# Patient Record
Sex: Female | Born: 1985 | Race: White | Hispanic: No | Marital: Single | Smoking: Current every day smoker
Health system: Southern US, Community
[De-identification: ages and names within clinical notes are randomized; demographics above are authoritative.]

## PROBLEM LIST (undated history)

## (undated) DIAGNOSIS — G43909 Migraine, unspecified, not intractable, without status migrainosus: Secondary | ICD-10-CM

## (undated) HISTORY — PX: FINGER TENDON REPAIR: SHX1640

## (undated) HISTORY — DX: Migraine, unspecified, not intractable, without status migrainosus: G43.909

## (undated) HISTORY — PX: TONSILLECTOMY: SUR1361

---

## 2007-03-19 ENCOUNTER — Emergency Department: Payer: Self-pay | Admitting: Emergency Medicine

## 2007-03-22 ENCOUNTER — Ambulatory Visit: Payer: Self-pay | Admitting: Orthopaedic Surgery

## 2008-01-23 LAB — HM PAP SMEAR: HM Pap smear: NEGATIVE

## 2009-10-09 ENCOUNTER — Emergency Department: Payer: Self-pay | Admitting: Emergency Medicine

## 2013-01-27 ENCOUNTER — Emergency Department: Payer: Self-pay | Admitting: Emergency Medicine

## 2016-03-01 ENCOUNTER — Other Ambulatory Visit: Payer: Self-pay | Admitting: Obstetrics and Gynecology

## 2016-03-01 DIAGNOSIS — N63 Unspecified lump in unspecified breast: Secondary | ICD-10-CM

## 2016-03-16 ENCOUNTER — Other Ambulatory Visit: Payer: Self-pay

## 2016-03-16 ENCOUNTER — Inpatient Hospital Stay: Admission: RE | Admit: 2016-03-16 | Payer: Self-pay | Source: Ambulatory Visit

## 2016-03-16 ENCOUNTER — Ambulatory Visit: Payer: Self-pay | Attending: Obstetrics and Gynecology

## 2016-08-13 ENCOUNTER — Emergency Department
Admission: EM | Admit: 2016-08-13 | Discharge: 2016-08-13 | Disposition: A | Discharge status: Home / Self Care | Payer: 59 | Attending: Emergency Medicine | Admitting: Emergency Medicine

## 2016-08-13 DIAGNOSIS — F17210 Nicotine dependence, cigarettes, uncomplicated: Secondary | ICD-10-CM | POA: Insufficient documentation

## 2016-08-13 DIAGNOSIS — B9689 Other specified bacterial agents as the cause of diseases classified elsewhere: Secondary | ICD-10-CM

## 2016-08-13 DIAGNOSIS — L089 Local infection of the skin and subcutaneous tissue, unspecified: Secondary | ICD-10-CM | POA: Diagnosis present

## 2016-08-13 DIAGNOSIS — A499 Bacterial infection, unspecified: Secondary | ICD-10-CM | POA: Insufficient documentation

## 2016-08-13 MED ORDER — CLINDAMYCIN HCL 150 MG PO CAPS
300.0000 mg | ORAL_CAPSULE | Freq: Once | ORAL | Status: AC
  Administered 2016-08-13: 300 mg via ORAL
  Filled 2016-08-13: qty 2

## 2016-08-13 MED ORDER — CLINDAMYCIN HCL 300 MG PO CAPS: 300.0000 mg | ORAL_CAPSULE | Freq: Three times a day (TID) | ORAL | 0 refills | Status: AC

## 2016-08-13 MED ORDER — TRAMADOL HCL 50 MG PO TABS
50.0000 mg | ORAL_TABLET | Freq: Once | ORAL | Status: AC
  Administered 2016-08-13: 50 mg via ORAL
  Filled 2016-08-13: qty 1

## 2016-08-13 MED ORDER — TRAMADOL HCL 50 MG PO TABS: 50.0000 mg | ORAL_TABLET | Freq: Four times a day (QID) | ORAL | 0 refills | Status: DC | PRN

## 2016-08-13 NOTE — ED Notes (Signed)
Pt has tattoo on right arm which is a coverup - was putting a&d ointment on it. Has redness, small pustules and pt states her whole arm is sore.

## 2016-08-13 NOTE — ED Triage Notes (Signed)
Patient c/o possible infection to tattoo she received on upper right arm on Monday. Patient has visible swelling/rash to area. Pt reports pain radiates up and down arm, and limited shoulder mobility.

## 2016-08-13 NOTE — Discharge Instructions (Signed)
Please follow up with the primary care provider of your choice for symptoms that are not improving over the next 2 days.  Return to the ER if symptoms change or worsen if you are unable to schedule an appointment.

## 2016-08-13 NOTE — ED Provider Notes (Signed)
[facility] Emergency Department Provider Note  ____________________________________________  Time seen: Approximately 9:11 PM  I have reviewed the triage vital signs and the nursing notes.   HISTORY  Chief Complaint Wound Infection (tattoo)   HPI Michelle Fitzgerald is a 31 y.o. female who presents to the emergency department for evaluation of right arm infection. She states that she got a tattoo on Monday and began having pain and drainage 2 days ago. She denies fever. She has been applying a and D ointment and using peroxide on the are without any relief.  History reviewed. No pertinent past medical history.  There are no active problems to display for this patient.   Past Surgical History:  Procedure Laterality Date  . FINGER TENDON REPAIR    . TONSILLECTOMY      Prior to Admission medications   Medication Sig Start Date End Date Taking? Authorizing Provider  clindamycin (CLEOCIN) 300 MG capsule Take 1 capsule (300 mg total) by mouth 3 (three) times daily. 08/13/16 08/23/16  Chinita Pesterari B Arvo Ealy, FNP  traMADol (ULTRAM) 50 MG tablet Take 1 tablet (50 mg total) by mouth every 6 (six) hours as needed. 08/13/16   Chinita Pesterari B Kelyse Pask, FNP    Allergies Patient has no known allergies.  Family History  Problem Relation Age of Onset  . Diabetes Father   . Diabetes Maternal Grandmother   . Cancer Maternal Grandmother   . Hypertension Maternal Grandmother     Social History Social History  Substance Use Topics  . Smoking status: Current Some Day Smoker    Packs/day: 0.50    Types: Cigarettes  . Smokeless tobacco: Never Used  . Alcohol use No    Review of Systems  Constitutional: Negative for fever/chills Respiratory:  Negative for shortness of breath. Musculoskeletal:  Negative for pain. Skin:  Positive for redness, tenderness, and drainage Neurological: Negative for headaches, focal weakness or  numbness. ____________________________________________   PHYSICAL EXAM:  VITAL SIGNS: ED Triage Vitals  Enc Vitals Group     BP 08/13/16 2031 136/85     Pulse Rate 08/13/16 2031 (!) 124     Resp 08/13/16 2031 20     Temp 08/13/16 2031 98.1 F (36.7 C)     Temp Source 08/13/16 2031 Oral     SpO2 08/13/16 2031 99 %     Weight 08/13/16 2032 245 lb (111.1 kg)     Height 08/13/16 2032 5\' 3"  (1.6 m)     Head Circumference --      Peak Flow --      Pain Score 08/13/16 2032 4     Pain Loc --      Pain Edu? --      Excl. in GC? --      Constitutional: Alert and oriented. Well appearing and in no acute distress. Eyes: Conjunctivae are normal. EOMI. Nose: No congestion/rhinnorhea. Mouth/Throat: Mucous membranes are moist.   Neck: No stridor. Cardiovascular: Good peripheral circulation. Respiratory: Normal respiratory effort.  No retractions. Musculoskeletal: FROM throughout. Neurologic:  Normal speech and language. No gross focal neurologic deficits are appreciated. Skin:  Central area of the freshly placed tattoo on the right deltoid is erythematous with pustules scattered throughout over the red ink colored areas. No lymphangitis is noted.  ____________________________________________   LABS (all labs ordered are listed, but only abnormal results are displayed)  Labs Reviewed - No data to display ____________________________________________  EKG   ____________________________________________  RADIOLOGY  not indicated ____________________________________________   PROCEDURES  Procedure(s) performed:  None. ____________________________________________   INITIAL IMPRESSION / ASSESSMENT AND PLAN / ED COURSE  31 year old female presenting to the emergency department for treatment of skin infection to the right upper arm after having a tattoo placed of days ago. She will be started on clindamycin and tramadol and advised to follow-up with the primary care provider of  her choice in 2 days if not improving. She was instructed to return to the emergency department immediately for symptoms that change or worsen if she is unable schedule an appointment.  Pertinent labs & imaging results that were available during my care of the patient were reviewed by me and considered in my medical decision making (see chart for details).   ____________________________________________   FINAL CLINICAL IMPRESSION(S) / ED DIAGNOSES  Final diagnoses:  Localized bacterial skin infection    Discharge Medication List as of 08/13/2016  9:17 PM    START taking these medications   Details  clindamycin (CLEOCIN) 300 MG capsule Take 1 capsule (300 mg total) by mouth 3 (three) times daily., Starting Sat 08/13/2016, Until Tue 08/23/2016, Print    traMADol (ULTRAM) 50 MG tablet Take 1 tablet (50 mg total) by mouth every 6 (six) hours as needed., Starting Sat 08/13/2016, Print        If controlled substance prescribed during this visit, 12 month history viewed on the [place] prior to issuing an initial prescription for Schedule II or III opiod.   Note:  This document was prepared using Dragon voice recognition software and may include unintentional dictation errors.    Chinita Pester, FNP 08/14/16 0003    Sharman Cheek, MD 08/14/16 [phone]

## 2017-04-07 ENCOUNTER — Emergency Department
Admission: EM | Admit: 2017-04-07 | Discharge: 2017-04-07 | Disposition: A | Discharge status: Home / Self Care | Payer: 59 | Attending: Emergency Medicine | Admitting: Emergency Medicine

## 2017-04-07 ENCOUNTER — Other Ambulatory Visit: Payer: Self-pay

## 2017-04-07 DIAGNOSIS — F17210 Nicotine dependence, cigarettes, uncomplicated: Secondary | ICD-10-CM | POA: Insufficient documentation

## 2017-04-07 DIAGNOSIS — M5431 Sciatica, right side: Secondary | ICD-10-CM

## 2017-04-07 DIAGNOSIS — M5442 Lumbago with sciatica, left side: Secondary | ICD-10-CM | POA: Insufficient documentation

## 2017-04-07 DIAGNOSIS — M545 Low back pain: Secondary | ICD-10-CM | POA: Diagnosis present

## 2017-04-07 MED ORDER — METHYLPREDNISOLONE SODIUM SUCC 125 MG IJ SOLR
125.0000 mg | Freq: Once | INTRAMUSCULAR | Status: AC
Start: 2017-04-07 — End: 2017-04-07
  Administered 2017-04-07: 125 mg via INTRAMUSCULAR
  Filled 2017-04-07: qty 2

## 2017-04-07 MED ORDER — PREDNISONE 50 MG PO TABS: ORAL_TABLET | ORAL | 0 refills | Status: DC

## 2017-04-07 NOTE — ED Provider Notes (Signed)
[facility] Emergency Department Provider Note  ____________________________________________  Time seen: Approximately 9:32 PM  I have reviewed the triage vital signs and the nursing notes.   HISTORY  Chief Complaint Back Pain    HPI Michelle Fitzgerald is a 31 y.o. female presents to the emergency department with 10 out of 10 low back pain with radiculopathy of the right lower extremity.  Patient reports that she has a history of sciatica and her symptoms feel like episodes that she has experienced in the past.  She denies falls or mechanisms of trauma.  Patient originally sought care at urgent care and was referred to the emergency department because radiculopathy did not improve after 20 minutes after 60 mg of Toradol was administered.  Patient denies chest pain, chest tightness, shortness of breath, nausea, vomiting abdominal pain.   History reviewed. No pertinent past medical history.  There are no active problems to display for this patient.   Past Surgical History:  Procedure Laterality Date  . FINGER TENDON REPAIR    . TONSILLECTOMY      Prior to Admission medications   Medication Sig Start Date End Date Taking? Authorizing Provider  predniSONE (DELTASONE) 50 MG tablet Take one 50 mg tablet by mouth for the next five days. 04/07/17   Orvil FeilWoods, Mubashir Mallek M, PA-C  traMADol (ULTRAM) 50 MG tablet Take 1 tablet (50 mg total) by mouth every 6 (six) hours as needed. 08/13/16   Chinita Pesterriplett, Cari B, FNP    Allergies Patient has no known allergies.  Family History  Problem Relation Age of Onset  . Diabetes Father   . Diabetes Maternal Grandmother   . Cancer Maternal Grandmother   . Hypertension Maternal Grandmother     Social History Social History   Tobacco Use  . Smoking status: Current Some Day Smoker    Packs/day: 0.50    Types: Cigarettes  . Smokeless tobacco: Never Used  Substance Use Topics  . Alcohol use: No  . Drug use: No     Review of  Systems  Constitutional: No fever/chills Eyes: No visual changes. No discharge ENT: No upper respiratory complaints. Cardiovascular: no chest pain. Respiratory: no cough. No SOB. Gastrointestinal: No abdominal pain.  No nausea, no vomiting.  No diarrhea.  No constipation. Genitourinary: Negative for dysuria. No hematuria Musculoskeletal: Patient had low back pain with right lower extremity radiculopathy.  Skin: Negative for rash, abrasions, lacerations, ecchymosis. Neurological: Negative for headaches, focal weakness or numbness.   ____________________________________________   PHYSICAL EXAM:  VITAL SIGNS: ED Triage Vitals  Enc Vitals Group     BP 04/07/17 2004 112/73     Pulse Rate 04/07/17 2004 77     Resp 04/07/17 2004 18     Temp 04/07/17 2004 97.8 F (36.6 C)     Temp Source 04/07/17 2004 Oral     SpO2 04/07/17 2004 95 %     Weight 04/07/17 2002 250 lb (113.4 kg)     Height 04/07/17 2002 5\' 3"  (1.6 m)     Head Circumference --      Peak Flow --      Pain Score 04/07/17 2002 7     Pain Loc --      Pain Edu? --      Excl. in GC? --      Constitutional: Alert and oriented. Well appearing and in no acute distress. Eyes: Conjunctivae are normal. PERRL. EOMI. Head: Atraumatic. Cardiovascular: Normal rate, regular rhythm. Normal S1 and S2.  Good peripheral circulation. Respiratory: Normal respiratory effort without tachypnea or retractions. Lungs CTAB. Good air entry to the bases with no decreased or absent breath sounds. Gastrointestinal: Bowel sounds 4 quadrants. Soft and nontender to palpation. No guarding or rigidity. No palpable masses. No distention. No CVA tenderness. Musculoskeletal: Full range of motion to all extremities. No gross deformities appreciated.  Patient has positive straight leg raise test, right. Neurologic:  Normal speech and language. No gross focal neurologic deficits are appreciated.  Skin:  Skin is warm, dry and intact. No rash  noted. Psychiatric: Mood and affect are normal. Speech and behavior are normal. Patient exhibits appropriate insight and judgement.   ____________________________________________   LABS (all labs ordered are listed, but only abnormal results are displayed)  Labs Reviewed - No data to display ____________________________________________  EKG   ____________________________________________  RADIOLOGY   No results found.  ____________________________________________    PROCEDURES  Procedure(s) performed:    Procedures    Medications  methylPREDNISolone sodium succinate (SOLU-MEDROL) 125 mg/2 mL injection 125 mg (not administered)     ____________________________________________   INITIAL IMPRESSION / ASSESSMENT AND PLAN / ED COURSE  Pertinent labs & imaging results that were available during my care of the patient were reviewed by me and considered in my medical decision making (see chart for details).  Review of the [place] CSRS was performed in accordance of the [facility] prior to dispensing any controlled drugs.     Assessment and plan Low back pain with right lower extremity radiculopathy Patient presents to the emergency department with low back pain with right lower extremity radiculopathy.  Patient was previously given Toradol and patient reports that her pain improved after patient came to the emergency department.  Patient was given Solu-Medrol in the emergency department and discharged with prednisone.  She was advised to follow-up with primary care.  Vital signs are reassuring prior to discharge.  All patient questions were answered.     ____________________________________________  FINAL CLINICAL IMPRESSION(S) / ED DIAGNOSES  Final diagnoses:  Sciatica of right side      NEW MEDICATIONS STARTED DURING THIS VISIT:  ED Discharge Orders        Ordered    predniSONE (DELTASONE) 50 MG tablet     04/07/17 2127          This chart was dictated  using voice recognition software/Dragon. Despite best efforts to proofread, errors can occur which can change the meaning. Any change was purely unintentional.    Orvil FeilWoods, Anzleigh Slaven M, PA-C 04/07/17 2138    Arnaldo NatalMalinda, Paul F, MD 04/07/17 [phone]

## 2017-04-07 NOTE — ED Triage Notes (Signed)
[facility] with c/o lower back pain x2 days. Pt reports pain "shoots down my right leg into the right ankle". Pt states she was told she had a "bulging disc that was compressing the sciatic nerve 4 years ago". Pt was given 60mg  Toradol IM at 715 PTA. Pt denies any recent trauma, injury, or falls to account for back pain. Pt is A&O, in NAD; RR even, regular, and unlabored.

## 2017-04-07 NOTE — ED Notes (Signed)
Pt stating she is having a sciatic attack and has not had one in the last 4 years.  She had an XR from a chiropractor that showed she has a buldging disc at L2.  He has been in pain the last 2 days and has been taking Aleve with no relief.  Her pain is in her right lower back/hip going down into her leg.  She states it feels like pins and needles but she does not have and decreased nerve perception in that leg/foot.  "It just hurts" she says.

## 2017-04-12 ENCOUNTER — Other Ambulatory Visit: Payer: Self-pay | Admitting: Student

## 2017-04-12 DIAGNOSIS — M5441 Lumbago with sciatica, right side: Principal | ICD-10-CM

## 2017-04-12 DIAGNOSIS — G8929 Other chronic pain: Secondary | ICD-10-CM

## 2017-04-12 DIAGNOSIS — M5442 Lumbago with sciatica, left side: Principal | ICD-10-CM

## 2017-04-19 ENCOUNTER — Other Ambulatory Visit: Payer: Self-pay | Admitting: Student

## 2017-04-19 ENCOUNTER — Ambulatory Visit
Admission: RE | Admit: 2017-04-19 | Discharge: 2017-04-19 | Disposition: A | Discharge status: Home / Self Care | Payer: 59 | Source: Ambulatory Visit | Attending: Student | Admitting: Student

## 2017-04-19 DIAGNOSIS — M5127 Other intervertebral disc displacement, lumbosacral region: Secondary | ICD-10-CM | POA: Insufficient documentation

## 2017-04-19 DIAGNOSIS — M5442 Lumbago with sciatica, left side: Secondary | ICD-10-CM

## 2017-04-19 DIAGNOSIS — M5441 Lumbago with sciatica, right side: Secondary | ICD-10-CM

## 2017-04-19 DIAGNOSIS — G8929 Other chronic pain: Secondary | ICD-10-CM

## 2017-04-19 DIAGNOSIS — M48061 Spinal stenosis, lumbar region without neurogenic claudication: Secondary | ICD-10-CM | POA: Insufficient documentation

## 2017-04-19 DIAGNOSIS — M5126 Other intervertebral disc displacement, lumbar region: Secondary | ICD-10-CM | POA: Diagnosis not present

## 2017-04-25 ENCOUNTER — Other Ambulatory Visit: Payer: Self-pay | Admitting: Family Medicine

## 2017-04-25 DIAGNOSIS — M5416 Radiculopathy, lumbar region: Secondary | ICD-10-CM

## 2017-05-03 ENCOUNTER — Ambulatory Visit
Admission: RE | Admit: 2017-05-03 | Discharge: 2017-05-03 | Disposition: A | Discharge status: Home / Self Care | Payer: 59 | Source: Ambulatory Visit | Attending: Family Medicine | Admitting: Family Medicine

## 2017-05-03 DIAGNOSIS — M5416 Radiculopathy, lumbar region: Secondary | ICD-10-CM

## 2017-05-03 MED ORDER — IOPAMIDOL (ISOVUE-M 200) INJECTION 41%
1.0000 mL | Freq: Once | INTRAMUSCULAR | Status: AC
  Administered 2017-05-03: 1 mL via EPIDURAL

## 2017-05-03 MED ORDER — METHYLPREDNISOLONE ACETATE 40 MG/ML INJ SUSP (RADIOLOG
120.0000 mg | Freq: Once | INTRAMUSCULAR | Status: AC
  Administered 2017-05-03: 120 mg via EPIDURAL

## 2017-05-03 NOTE — Discharge Instructions (Signed)

## 2017-05-09 ENCOUNTER — Other Ambulatory Visit: Payer: Self-pay | Admitting: Student

## 2017-05-09 DIAGNOSIS — M5416 Radiculopathy, lumbar region: Secondary | ICD-10-CM

## 2017-05-24 ENCOUNTER — Ambulatory Visit
Admission: RE | Admit: 2017-05-24 | Discharge: 2017-05-24 | Disposition: A | Discharge status: Home / Self Care | Payer: 59 | Source: Ambulatory Visit | Attending: Student | Admitting: Student

## 2017-05-24 DIAGNOSIS — M5416 Radiculopathy, lumbar region: Secondary | ICD-10-CM

## 2017-05-24 MED ORDER — METHYLPREDNISOLONE ACETATE 40 MG/ML INJ SUSP (RADIOLOG
120.0000 mg | Freq: Once | INTRAMUSCULAR | Status: AC
  Administered 2017-05-24: 120 mg via EPIDURAL

## 2017-05-24 MED ORDER — IOPAMIDOL (ISOVUE-M 200) INJECTION 41%
1.0000 mL | Freq: Once | INTRAMUSCULAR | Status: AC
  Administered 2017-05-24: 1 mL via EPIDURAL

## 2017-06-07 ENCOUNTER — Other Ambulatory Visit: Payer: 59

## 2017-06-07 ENCOUNTER — Other Ambulatory Visit: Payer: Self-pay

## 2017-06-07 ENCOUNTER — Encounter
Admission: RE | Admit: 2017-06-07 | Discharge: 2017-06-07 | Disposition: A | Discharge status: Home / Self Care | Payer: BLUE CROSS/BLUE SHIELD | Source: Ambulatory Visit | Attending: Neurosurgery | Admitting: Neurosurgery

## 2017-06-07 DIAGNOSIS — Z01812 Encounter for preprocedural laboratory examination: Secondary | ICD-10-CM | POA: Insufficient documentation

## 2017-06-07 DIAGNOSIS — M5416 Radiculopathy, lumbar region: Secondary | ICD-10-CM | POA: Diagnosis not present

## 2017-06-07 DIAGNOSIS — Z01810 Encounter for preprocedural cardiovascular examination: Secondary | ICD-10-CM | POA: Insufficient documentation

## 2017-06-07 LAB — CBC
HEMATOCRIT: 44.1 % (ref 35.0–47.0)
Hemoglobin: 15 g/dL (ref 12.0–16.0)
MCH: 29.5 pg (ref 26.0–34.0)
MCHC: 33.9 g/dL (ref 32.0–36.0)
MCV: 87.1 fL (ref 80.0–100.0)
PLATELETS: 317 10*3/uL (ref 150–440)
RBC: 5.06 MIL/uL (ref 3.80–5.20)
RDW: 13.1 % (ref 11.5–14.5)
WBC: 10 10*3/uL (ref 3.6–11.0)

## 2017-06-07 LAB — URINALYSIS, COMPLETE (UACMP) WITH MICROSCOPIC
BILIRUBIN URINE: NEGATIVE
GLUCOSE, UA: NEGATIVE mg/dL
Ketones, ur: NEGATIVE mg/dL
LEUKOCYTES UA: NEGATIVE
NITRITE: NEGATIVE
PH: 5 (ref 5.0–8.0)
Protein, ur: 30 mg/dL — AB
SPECIFIC GRAVITY, URINE: 1.02 (ref 1.005–1.030)

## 2017-06-07 LAB — BASIC METABOLIC PANEL
Anion gap: 8 (ref 5–15)
BUN: 23 mg/dL — AB (ref 6–20)
CALCIUM: 9.2 mg/dL (ref 8.9–10.3)
CO2: 21 mmol/L — AB (ref 22–32)
Chloride: 109 mmol/L (ref 101–111)
Creatinine, Ser: 0.72 mg/dL (ref 0.44–1.00)
GFR calc Af Amer: >60 mL/min (ref >60)
GLUCOSE: 97 mg/dL (ref 65–99)
Potassium: 3.9 mmol/L (ref 3.5–5.1)
Sodium: 138 mmol/L (ref 135–145)

## 2017-06-07 LAB — TYPE AND SCREEN
ABO/RH(D): A POS
Antibody Screen: NEGATIVE

## 2017-06-07 LAB — SURGICAL PCR SCREEN
MRSA, PCR: NEGATIVE
Staphylococcus aureus: NEGATIVE

## 2017-06-07 LAB — PROTIME-INR
INR: 0.87
Prothrombin Time: 11.8 seconds (ref 11.4–15.2)

## 2017-06-07 LAB — APTT: APTT: 26 s (ref 24–36)

## 2017-06-07 NOTE — Pre-Procedure Instructions (Signed)
Abnormal urinalysis result to Dr Vernia BuffYarbrough's offfice

## 2017-06-07 NOTE — Patient Instructions (Signed)
Your [facility] is [facility] on: [facility] on the 2nd floor in the [facility]. To find out your arrival time, please call [phone] between 1PM - 3PM on: Tuesday June 13, 2017  REMEMBER: Instructions that are not followed completely may result in serious medical risk, up to and including death; or upon the discretion of your surgeon and anesthesiologist your surgery may need to be rescheduled.  Do not eat food after midnight the night before your procedure.  No gum chewing or hard candies.  You may however, drink CLEAR liquids up to 2 hours before you are scheduled to arrive at the hospital for your procedure.  Do not drink clear liquids within 2 hours of the start of your surgery.  Clear liquids include: - water  - apple juice without pulp - clear gatorade - black coffee or tea (Do NOT add anything to the coffee or tea) Do NOT drink anything that is not on this list.  No Alcohol for 24 hours before or after surgery.  No Smoking including e-cigarettes for 24 hours prior to surgery. No chewable tobacco products for at least 6 hours prior to surgery. No nicotine patches on the day of surgery.  Notify your doctor if there is any change in your medical condition (cold, fever, infection).  Do not wear jewelry, make-up, hairpins, clips or nail polish.  Do not wear lotions, powders, or perfumes. You may NOT wear deodorant.  Do not shave 48 hours prior to surgery. Men may shave face and neck.  Contacts and dentures may not be worn into surgery.  Do not bring valuables to the hospital. [facility] is not responsible for any belongings or valuables.   TAKE THESE MEDICATIONS THE MORNING OF SURGERY WITH A SIP OF WATER: NO MED NEEDED    Use CHG Soap or wipes as directed on instruction sheet.  Follow recommendations from Cardiologist, Pulmonologist or PCP regarding stopping Aspirin, Coumadin, Plavix, Eliquis, Pradaxa, or  Pletal.  Stop Anti-inflammatories such as Advil, Aleve, Ibuprofen, Motrin, Naproxen, Naprosyn, Goodie powder, or aspirin products,  CELEBREX. (May take Tylenol or Acetaminophen if needed.)  Stop ANY OVER THE COUNTER supplements until after surgery. (May continue Vitamin D, Vitamin B, and multivitamin.)  If you are being admitted to the hospital overnight, leave your suitcase in the car. After surgery it may be brought to your room.  If you are being discharged the day of surgery, you will not be allowed to drive home. You will need someone to drive you home and stay with you that night.   If you are taking public transportation, you will need to have a responsible adult to with you.  Please call the number above if you have any questions about these instructions.

## 2017-06-13 MED ORDER — CEFAZOLIN SODIUM-DEXTROSE 2-4 GM/100ML-% IV SOLN
2.0000 g | INTRAVENOUS | Status: AC
  Administered 2017-06-14: 2 g via INTRAVENOUS

## 2017-06-13 NOTE — Progress Notes (Signed)
ANTIBIOTIC CONSULT NOTE - INITIAL  Pharmacy Consult for Ancef Indication: surgical prophylaxis  No Known Allergies  Patient Measurements:   Adjusted Body Weight:   Vital Signs:   Intake/Output from previous day: No intake/output data recorded. Intake/Output from this shift: No intake/output data recorded.  Labs: No results for input(s): WBC, HGB, PLT, LABCREA, CREATININE in the last 72 hours. Estimated Creatinine Clearance: 119.5 mL/min (by C-G formula based on SCr of 0.72 mg/dL). No results for input(s): VANCOTROUGH, VANCOPEAK, VANCORANDOM, GENTTROUGH, GENTPEAK, GENTRANDOM, TOBRATROUGH, TOBRAPEAK, TOBRARND, AMIKACINPEAK, AMIKACINTROU, AMIKACIN in the last 72 hours.   Microbiology: Recent Results (from the past 720 hour(s))  Surgical pcr screen     Status: None   Collection Time: 06/07/17 11:33 AM  Result Value Ref Range Status   MRSA, PCR NEGATIVE NEGATIVE Final   Staphylococcus aureus NEGATIVE NEGATIVE Final    Comment: (NOTE) The Xpert SA Assay (FDA approved for NASAL specimens in patients 33 years of age and older), is one component of a comprehensive surveillance program. It is not intended to diagnose infection nor to guide or monitor treatment. Performed at [facility], [address] [place], [place] [address]     Medical History: No past medical history on file.  Medications:  Infusions:  Assessment: 32 yof scheduled for lumbar laminectomy/decompression microdiscectomy 2 levels L4-5, L5-S1. Pharmacy consulted to dose Ancef for surgical prophylaxis. No drug allergies noted.   Goal of Therapy:    Plan:  Cefazolin 2 gm IV x 1. Redosing may be necessary in procedures lasting longer than 3 hours. Post op doses may be required in certain procedures/patients. Please re-consult pharmacy if more than a single pre-op dose is required.  Carola FrostNathan A Tiny Chaudhary, Pharm.D., BCPS Clinical Pharmacist 06/13/2017,2:43 PM

## 2017-06-14 ENCOUNTER — Encounter
Admission: RE | Disposition: A | Discharge status: Home / Self Care | Payer: Self-pay | Source: Ambulatory Visit | Attending: Neurosurgery

## 2017-06-14 ENCOUNTER — Ambulatory Visit: Payer: BLUE CROSS/BLUE SHIELD | Admitting: Certified Registered Nurse Anesthetist

## 2017-06-14 ENCOUNTER — Encounter: Payer: Self-pay | Admitting: *Deleted

## 2017-06-14 ENCOUNTER — Ambulatory Visit
Admission: RE | Admit: 2017-06-14 | Discharge: 2017-06-14 | Disposition: A | Discharge status: Home / Self Care | Payer: BLUE CROSS/BLUE SHIELD | Source: Ambulatory Visit | Attending: Neurosurgery | Admitting: Neurosurgery

## 2017-06-14 ENCOUNTER — Other Ambulatory Visit: Payer: Self-pay

## 2017-06-14 ENCOUNTER — Ambulatory Visit: Payer: BLUE CROSS/BLUE SHIELD

## 2017-06-14 DIAGNOSIS — M4696 Unspecified inflammatory spondylopathy, lumbar region: Secondary | ICD-10-CM | POA: Diagnosis not present

## 2017-06-14 DIAGNOSIS — M48061 Spinal stenosis, lumbar region without neurogenic claudication: Secondary | ICD-10-CM | POA: Diagnosis not present

## 2017-06-14 DIAGNOSIS — M5117 Intervertebral disc disorders with radiculopathy, lumbosacral region: Secondary | ICD-10-CM | POA: Insufficient documentation

## 2017-06-14 DIAGNOSIS — Z791 Long term (current) use of non-steroidal anti-inflammatories (NSAID): Secondary | ICD-10-CM | POA: Diagnosis not present

## 2017-06-14 DIAGNOSIS — F17200 Nicotine dependence, unspecified, uncomplicated: Secondary | ICD-10-CM | POA: Diagnosis not present

## 2017-06-14 DIAGNOSIS — Z6841 Body Mass Index (BMI) 40.0 and over, adult: Secondary | ICD-10-CM | POA: Diagnosis not present

## 2017-06-14 DIAGNOSIS — Z419 Encounter for procedure for purposes other than remedying health state, unspecified: Secondary | ICD-10-CM

## 2017-06-14 HISTORY — PX: LUMBAR LAMINECTOMY/DECOMPRESSION MICRODISCECTOMY: SHX5026

## 2017-06-14 LAB — POCT PREGNANCY, URINE: PREG TEST UR: NEGATIVE

## 2017-06-14 SURGERY — LUMBAR LAMINECTOMY/DECOMPRESSION MICRODISCECTOMY 2 LEVELS
Anesthesia: General | Site: Spine Lumbar | Wound class: Clean

## 2017-06-14 MED ORDER — GELATIN ABSORBABLE 12-7 MM EX MISC
CUTANEOUS | Status: AC
  Filled 2017-06-14: qty 1

## 2017-06-14 MED ORDER — DEXAMETHASONE SODIUM PHOSPHATE 10 MG/ML IJ SOLN
INTRAMUSCULAR | Status: DC | PRN
  Administered 2017-06-14: 10 mg via INTRAVENOUS

## 2017-06-14 MED ORDER — ACETAMINOPHEN 10 MG/ML IV SOLN
INTRAVENOUS | Status: AC
  Filled 2017-06-14: qty 100

## 2017-06-14 MED ORDER — BUPIVACAINE-EPINEPHRINE (PF) 0.5% -1:200000 IJ SOLN
INTRAMUSCULAR | Status: AC
  Filled 2017-06-14: qty 30

## 2017-06-14 MED ORDER — SUCCINYLCHOLINE CHLORIDE 20 MG/ML IJ SOLN
INTRAMUSCULAR | Status: DC | PRN
  Administered 2017-06-14: 100 mg via INTRAVENOUS

## 2017-06-14 MED ORDER — ROCURONIUM BROMIDE 50 MG/5ML IV SOLN
INTRAVENOUS | Status: AC
  Filled 2017-06-14: qty 1

## 2017-06-14 MED ORDER — BACITRACIN 50000 UNITS IM SOLR
INTRAMUSCULAR | Status: AC
  Filled 2017-06-14: qty 1

## 2017-06-14 MED ORDER — ONDANSETRON HCL 4 MG/2ML IJ SOLN
4.0000 mg | Freq: Once | INTRAMUSCULAR | Status: AC | PRN
  Administered 2017-06-14: 4 mg via INTRAVENOUS

## 2017-06-14 MED ORDER — THROMBIN (RECOMBINANT) 5000 UNITS EX SOLR
CUTANEOUS | Status: DC | PRN
  Administered 2017-06-14: 5000 [IU] via TOPICAL

## 2017-06-14 MED ORDER — FAMOTIDINE 20 MG PO TABS
ORAL_TABLET | ORAL | Status: AC
  Filled 2017-06-14: qty 1

## 2017-06-14 MED ORDER — SUCCINYLCHOLINE CHLORIDE 20 MG/ML IJ SOLN
INTRAMUSCULAR | Status: AC
  Filled 2017-06-14: qty 1

## 2017-06-14 MED ORDER — BUPIVACAINE-EPINEPHRINE (PF) 0.5% -1:200000 IJ SOLN
INTRAMUSCULAR | Status: DC | PRN
  Administered 2017-06-14: 10 mL

## 2017-06-14 MED ORDER — ACETAMINOPHEN 10 MG/ML IV SOLN
INTRAVENOUS | Status: DC | PRN
  Administered 2017-06-14: 1000 mg via INTRAVENOUS

## 2017-06-14 MED ORDER — MIDAZOLAM HCL 2 MG/2ML IJ SOLN
INTRAMUSCULAR | Status: AC
  Filled 2017-06-14: qty 2

## 2017-06-14 MED ORDER — OXYCODONE HCL 5 MG PO TABS
ORAL_TABLET | ORAL | Status: AC
  Filled 2017-06-14: qty 1

## 2017-06-14 MED ORDER — CEFAZOLIN SODIUM-DEXTROSE 2-4 GM/100ML-% IV SOLN
INTRAVENOUS | Status: AC
  Filled 2017-06-14: qty 100

## 2017-06-14 MED ORDER — FAMOTIDINE 20 MG PO TABS
20.0000 mg | ORAL_TABLET | Freq: Once | ORAL | Status: AC
  Administered 2017-06-14: 20 mg via ORAL

## 2017-06-14 MED ORDER — MIDAZOLAM HCL 2 MG/2ML IJ SOLN
INTRAMUSCULAR | Status: DC | PRN
  Administered 2017-06-14: 2 mg via INTRAVENOUS

## 2017-06-14 MED ORDER — ONDANSETRON HCL 4 MG/2ML IJ SOLN
INTRAMUSCULAR | Status: AC
  Filled 2017-06-14: qty 2

## 2017-06-14 MED ORDER — SODIUM CHLORIDE 0.9 % IV SOLN
INTRAVENOUS | Status: DC | PRN
  Administered 2017-06-14: 40 mL

## 2017-06-14 MED ORDER — OXYCODONE HCL 5 MG PO TABS: 5.0000 mg | ORAL_TABLET | ORAL | 0 refills | Status: DC | PRN

## 2017-06-14 MED ORDER — PROPOFOL 10 MG/ML IV BOLUS
INTRAVENOUS | Status: DC | PRN
  Administered 2017-06-14: 160 mg via INTRAVENOUS

## 2017-06-14 MED ORDER — THROMBIN (RECOMBINANT) 5000 UNITS EX SOLR
CUTANEOUS | Status: AC
  Filled 2017-06-14: qty 5000

## 2017-06-14 MED ORDER — LIDOCAINE HCL (PF) 2 % IJ SOLN
INTRAMUSCULAR | Status: AC
  Filled 2017-06-14: qty 10

## 2017-06-14 MED ORDER — ROCURONIUM BROMIDE 100 MG/10ML IV SOLN
INTRAVENOUS | Status: DC | PRN
  Administered 2017-06-14: 10 mg via INTRAVENOUS
  Administered 2017-06-14: 40 mg via INTRAVENOUS

## 2017-06-14 MED ORDER — DEXAMETHASONE SODIUM PHOSPHATE 10 MG/ML IJ SOLN
INTRAMUSCULAR | Status: AC
  Filled 2017-06-14: qty 1

## 2017-06-14 MED ORDER — METHYLPREDNISOLONE ACETATE 40 MG/ML IJ SUSP
INTRAMUSCULAR | Status: DC | PRN
  Administered 2017-06-14: 40 mg

## 2017-06-14 MED ORDER — BUPIVACAINE LIPOSOME 1.3 % IJ SUSP
INTRAMUSCULAR | Status: AC
Start: 2017-06-14 — End: ?
  Filled 2017-06-14: qty 20

## 2017-06-14 MED ORDER — OXYCODONE HCL 5 MG PO TABS
5.0000 mg | ORAL_TABLET | ORAL | Status: DC | PRN
  Administered 2017-06-14: 5 mg via ORAL
  Filled 2017-06-14 (×2): qty 1

## 2017-06-14 MED ORDER — ONDANSETRON HCL 4 MG/2ML IJ SOLN
INTRAMUSCULAR | Status: AC
  Administered 2017-06-14: 4 mg via INTRAVENOUS
  Filled 2017-06-14: qty 2

## 2017-06-14 MED ORDER — METHYLPREDNISOLONE ACETATE 40 MG/ML IJ SUSP
INTRAMUSCULAR | Status: AC
  Filled 2017-06-14: qty 1

## 2017-06-14 MED ORDER — LIDOCAINE HCL (CARDIAC) 20 MG/ML IV SOLN
INTRAVENOUS | Status: DC | PRN
  Administered 2017-06-14: 100 mg via INTRAVENOUS

## 2017-06-14 MED ORDER — METHOCARBAMOL 500 MG PO TABS: 500.0000 mg | ORAL_TABLET | Freq: Three times a day (TID) | ORAL | 0 refills | Status: DC

## 2017-06-14 MED ORDER — FENTANYL CITRATE (PF) 250 MCG/5ML IJ SOLN
INTRAMUSCULAR | Status: AC
  Filled 2017-06-14: qty 5

## 2017-06-14 MED ORDER — BACITRACIN 50000 UNITS IM SOLR
INTRAMUSCULAR | Status: DC | PRN
  Administered 2017-06-14: 50000 [IU]

## 2017-06-14 MED ORDER — SODIUM CHLORIDE FLUSH 0.9 % IV SOLN
INTRAVENOUS | Status: AC
  Filled 2017-06-14: qty 10

## 2017-06-14 MED ORDER — LACTATED RINGERS IV SOLN
INTRAVENOUS | Status: DC
  Administered 2017-06-14: 07:00:00 via INTRAVENOUS

## 2017-06-14 MED ORDER — ONDANSETRON HCL 4 MG/2ML IJ SOLN
INTRAMUSCULAR | Status: DC | PRN
  Administered 2017-06-14: 4 mg via INTRAVENOUS

## 2017-06-14 MED ORDER — FENTANYL CITRATE (PF) 100 MCG/2ML IJ SOLN
25.0000 ug | INTRAMUSCULAR | Status: DC | PRN
  Administered 2017-06-14 (×4): 25 ug via INTRAVENOUS

## 2017-06-14 MED ORDER — SODIUM CHLORIDE 0.9 % IJ SOLN
INTRAMUSCULAR | Status: AC
  Filled 2017-06-14: qty 50

## 2017-06-14 MED ORDER — BUPIVACAINE HCL (PF) 0.5 % IJ SOLN
INTRAMUSCULAR | Status: AC
Start: 2017-06-14 — End: ?
  Filled 2017-06-14: qty 30

## 2017-06-14 MED ORDER — FENTANYL CITRATE (PF) 100 MCG/2ML IJ SOLN
INTRAMUSCULAR | Status: DC | PRN
  Administered 2017-06-14: 50 ug via INTRAVENOUS
  Administered 2017-06-14 (×2): 100 ug via INTRAVENOUS

## 2017-06-14 MED ORDER — SUGAMMADEX SODIUM 200 MG/2ML IV SOLN
INTRAVENOUS | Status: DC | PRN
  Administered 2017-06-14: 200 mg via INTRAVENOUS

## 2017-06-14 MED ORDER — BUPIVACAINE HCL (PF) 0.5 % IJ SOLN
INTRAMUSCULAR | Status: DC | PRN
  Administered 2017-06-14: 20 mL

## 2017-06-14 MED ORDER — PROPOFOL 10 MG/ML IV BOLUS
INTRAVENOUS | Status: AC
  Filled 2017-06-14: qty 20

## 2017-06-14 MED ORDER — FENTANYL CITRATE (PF) 100 MCG/2ML IJ SOLN
INTRAMUSCULAR | Status: AC
  Administered 2017-06-14: 25 ug via INTRAVENOUS
  Filled 2017-06-14: qty 2

## 2017-06-14 MED ORDER — SUGAMMADEX SODIUM 200 MG/2ML IV SOLN
INTRAVENOUS | Status: AC
  Filled 2017-06-14: qty 2

## 2017-06-14 SURGICAL SUPPLY — 66 items
BLADE BOVIE TIP EXT 4 (BLADE) ×3 IMPLANT
BUR NEURO DRILL SOFT 3.0X3.8M (BURR) ×3 IMPLANT
CANISTER SUCT 1200ML W/VALVE (MISCELLANEOUS) ×6 IMPLANT
CHLORAPREP W/TINT 26ML (MISCELLANEOUS) ×6 IMPLANT
CNTNR SPEC 2.5X3XGRAD LEK (MISCELLANEOUS) ×1
CONT SPEC 4OZ STER OR WHT (MISCELLANEOUS) ×2
CONTAINER SPEC 2.5X3XGRAD LEK (MISCELLANEOUS) ×1 IMPLANT
COUNTER NEEDLE 20/40 LG (NEEDLE) ×3 IMPLANT
COVER LIGHT HANDLE STERIS (MISCELLANEOUS) ×6 IMPLANT
CUP MEDICINE 2OZ PLAST GRAD ST (MISCELLANEOUS) ×6 IMPLANT
DERMABOND ADVANCED (GAUZE/BANDAGES/DRESSINGS) ×2
DERMABOND ADVANCED .7 DNX12 (GAUZE/BANDAGES/DRESSINGS) ×1 IMPLANT
DRAPE C-ARM 42X72 X-RAY (DRAPES) ×6 IMPLANT
DRAPE LAPAROTOMY 100X77 ABD (DRAPES) ×3 IMPLANT
DRAPE MICROSCOPE SPINE 48X150 (DRAPES) ×3 IMPLANT
DRAPE POUCH INSTRU U-SHP 10X18 (DRAPES) ×3 IMPLANT
DRAPE SURG 17X11 SM STRL (DRAPES) ×12 IMPLANT
DRSG TEGADERM 4X4.75 (GAUZE/BANDAGES/DRESSINGS) ×3 IMPLANT
DRSG TELFA 4X3 1S NADH ST (GAUZE/BANDAGES/DRESSINGS) IMPLANT
ELECT CAUTERY BLADE TIP 2.5 (TIP) ×3
ELECT EZSTD 165MM 6.5IN (MISCELLANEOUS) ×3
ELECTRODE CAUTERY BLDE TIP 2.5 (TIP) ×1 IMPLANT
ELECTRODE EZSTD 165MM 6.5IN (MISCELLANEOUS) ×1 IMPLANT
EVICEL AIRLESS SPRAY ACCES (MISCELLANEOUS) ×3 IMPLANT
FRAME EYE SHIELD (PROTECTIVE WEAR) ×3 IMPLANT
GLOVE BIO SURGEON STRL SZ 6.5 (GLOVE) ×4 IMPLANT
GLOVE BIO SURGEONS STRL SZ 6.5 (GLOVE) ×2
GLOVE BIOGEL PI IND STRL 7.0 (GLOVE) ×2 IMPLANT
GLOVE BIOGEL PI INDICATOR 7.0 (GLOVE) ×4
GLOVE SURG SYN 8.5  E (GLOVE) ×6
GLOVE SURG SYN 8.5 E (GLOVE) ×3 IMPLANT
GOWN SRG XL LVL 3 NONREINFORCE (GOWNS) ×1 IMPLANT
GOWN STRL NON-REIN TWL XL LVL3 (GOWNS) ×2
GOWN STRL REUS W/ TWL LRG LVL3 (GOWN DISPOSABLE) ×1 IMPLANT
GOWN STRL REUS W/TWL LRG LVL3 (GOWN DISPOSABLE) ×2
GRADUATE 1200CC STRL 31836 (MISCELLANEOUS) ×3 IMPLANT
IV CATH ANGIO 12GX3 LT BLUE (NEEDLE) ×3 IMPLANT
KIT SPINAL PRONEVIEW (KITS) ×3 IMPLANT
KNIFE BAYONET SHORT DISCETOMY (MISCELLANEOUS) ×3 IMPLANT
MARKER SKIN DUAL TIP RULER LAB (MISCELLANEOUS) ×6 IMPLANT
NDL SAFETY ECLIPSE 18X1.5 (NEEDLE) ×1 IMPLANT
NEEDLE HYPO 18GX1.5 SHARP (NEEDLE) ×2
NEEDLE HYPO 22GX1.5 SAFETY (NEEDLE) ×3 IMPLANT
NS IRRIG 1000ML POUR BTL (IV SOLUTION) ×3 IMPLANT
PACK LAMINECTOMY NEURO (CUSTOM PROCEDURE TRAY) ×3 IMPLANT
PAD ARMBOARD 7.5X6 YLW CONV (MISCELLANEOUS) ×3 IMPLANT
PATTIES SURGICAL .5X1.5 (GAUZE/BANDAGES/DRESSINGS) IMPLANT
SPOGE SURGIFLO 8M (HEMOSTASIS) ×4
SPONGE SURGIFLO 8M (HEMOSTASIS) ×2 IMPLANT
STAPLER SKIN PROX 35W (STAPLE) IMPLANT
SUT DVC VLOC 3-0 CL 6 P-12 (SUTURE) ×3 IMPLANT
SUT NURALON 4 0 TR CR/8 (SUTURE) IMPLANT
SUT VIC AB 0 CT1 27 (SUTURE) ×2
SUT VIC AB 0 CT1 27XCR 8 STRN (SUTURE) ×1 IMPLANT
SUT VIC AB 2-0 CT1 18 (SUTURE) ×3 IMPLANT
SUT VICRYL 0 AB UR-6 (SUTURE) ×3 IMPLANT
SUT VICRYL 0 UR6 27IN ABS (SUTURE) IMPLANT
SYR 10ML LL (SYRINGE) ×3 IMPLANT
SYR 20CC LL (SYRINGE) ×3 IMPLANT
SYR 30ML LL (SYRINGE) ×6 IMPLANT
SYR 3ML LL SCALE MARK (SYRINGE) ×3 IMPLANT
TOWEL OR 17X26 4PK STRL BLUE (TOWEL DISPOSABLE) ×12 IMPLANT
TUBE MATRX SPINL 18MM 9CM DISP (INSTRUMENTS) ×2
TUBE METRX SPINAL 18X9 DISP (INSTRUMENTS) ×1 IMPLANT
TUBING CONNECTING 10 (TUBING) ×2 IMPLANT
TUBING CONNECTING 10' (TUBING) ×1

## 2017-06-14 NOTE — H&P (Signed)
I have reviewed and confirmed my history and physical from 06/01/2017 with no additions or changes. Plan for R L4-5 and L5-S1 microdiscectomy.  Risks and benefits reviewed.  Heart sounds normal no MRG. Chest Clear to Auscultation Bilaterally.

## 2017-06-14 NOTE — Anesthesia Procedure Notes (Signed)
Procedure Name: Intubation Date/Time: 06/14/2017 7:33 AM Performed by: Frazier, Susan, CRNA Pre-anesthesia Checklist: Patient identified, Emergency Drugs available, Suction available, Patient being monitored and Timeout performed Patient Re-evaluated:Patient Re-evaluated prior to induction Oxygen Delivery Method: Circle system utilized Preoxygenation: Pre-oxygenation with 100% oxygen Induction Type: IV induction Ventilation: Mask ventilation without difficulty Laryngoscope Size: Miller and 2 Grade View: Grade I Tube type: Oral Tube size: 7.5 mm Number of attempts: 1 Airway Equipment and Method: Stylet and LTA kit utilized Placement Confirmation: ETT inserted through vocal cords under direct vision,  positive ETCO2 and breath sounds checked- equal and bilateral Secured at: 22 cm Tube secured with: Tape Dental Injury: Teeth and Oropharynx as per pre-operative assessment        

## 2017-06-14 NOTE — Anesthesia Preprocedure Evaluation (Signed)
Anesthesia Evaluation  Patient identified by MRN, date of birth, ID band Patient awake    Reviewed: Allergy & Precautions, NPO status , Patient's Chart, lab work & pertinent test results  Airway Mallampati: II  TM Distance: <3 FB     Dental  (+) Teeth Intact   Pulmonary Current Smoker,    Pulmonary exam normal        Cardiovascular negative cardio ROS Normal cardiovascular exam     Neuro/Psych negative neurological ROS  negative psych ROS   GI/Hepatic negative GI ROS, Neg liver ROS,   Endo/Other  Morbid obesity  Renal/GU negative Renal ROS  negative genitourinary   Musculoskeletal  (+) Arthritis , Osteoarthritis,    Abdominal Normal abdominal exam  (+)   Peds negative pediatric ROS (+)  Hematology negative hematology ROS (+)   Anesthesia Other Findings   Reproductive/Obstetrics                             Anesthesia Physical Anesthesia Plan  ASA: II  Anesthesia Plan: General   Post-op Pain Management:    Induction: Intravenous  PONV Risk Score and Plan:   Airway Management Planned: Oral ETT  Additional Equipment:   Intra-op Plan:   Post-operative Plan:   Informed Consent: I have reviewed the patients History and Physical, chart, labs and discussed the procedure including the risks, benefits and alternatives for the proposed anesthesia with the patient or authorized representative who has indicated his/her understanding and acceptance.   Dental advisory given  Plan Discussed with: CRNA and Surgeon  Anesthesia Plan Comments:         Anesthesia Quick Evaluation

## 2017-06-14 NOTE — Progress Notes (Signed)
Pain 4/10 , able to ambulate to restroom with assist , voided 200 ml , tolerating po fluids .

## 2017-06-14 NOTE — Discharge Instructions (Addendum)
AMBULATORY SURGERY  °DISCHARGE INSTRUCTIONS ° ° °1) The drugs that you were given will stay in your system until tomorrow so for the next 24 hours you should not: ° °A) Drive an automobile °B) Make any legal decisions °C) Drink any alcoholic beverage ° ° °2) You may resume regular meals tomorrow.  Today it is better to start with liquids and gradually work up to solid foods. ° °You may eat anything you prefer, but it is better to start with liquids, then soup and crackers, and gradually work up to solid foods. ° ° °3) Please notify your doctor immediately if you have any unusual bleeding, trouble breathing, redness and pain at the surgery site, drainage, fever, or pain not relieved by medication. °4)  ° °5) Your post-operative visit with Dr.                     °           °     is: Date:                        Time:   ° °Please call to schedule your post-operative visit. ° °6) Additional Instructions: ° ° ° ° ° ° °Your surgeon has performed an operation on your lumbar spine (low back) to relieve pressure on one or more nerves. Many times, patients feel better immediately after surgery and can “overdo it.” Even if you feel well, it is important that you follow these activity guidelines. If you do not let your back heal properly from the surgery, you can increase the chance of a disc herniation and/or return of your symptoms. The following are instructions to help in your recovery once you have been discharged from the hospital. ° °* Do not take anti-inflammatory medications for 3 days after surgery (naproxen [Aleve], ibuprofen [Advil, Motrin], celecoxib [Celebrex], etc.) ° °Activity  °  °No bending, lifting, or twisting (“BLT”). Avoid lifting objects heavier than 10 pounds (gallon milk jug).  Where possible, avoid household activities that involve lifting, bending, pushing, or pulling such as laundry, vacuuming, grocery shopping, and childcare. Try to arrange for help from friends and family for these activities  while your back heals. ° °Increase physical activity slowly as tolerated.  Taking short walks is encouraged, but avoid strenuous exercise. Do not jog, run, bicycle, lift weights, or participate in any other exercises unless specifically allowed by your doctor. Avoid prolonged sitting, including car rides. ° °Talk to your doctor before resuming sexual activity. ° °You should not drive until cleared by your doctor. ° °Until released by your doctor, you should not return to work or school.  You should rest at home and let your body heal.  ° °You may shower two days after your surgery.  After showering, lightly dab your incision dry. Do not take a tub bath or go swimming for 3 weeks, or until approved by your doctor at your follow-up appointment. ° °If you smoke, we strongly recommend that you quit.  Smoking has been proven to interfere with normal healing in your back and will dramatically reduce the success rate of your surgery. Please contact [facility] (800-QUIT-NOW) and use the resources at www.[facility].com for assistance in stopping smoking. ° °Surgical Incision °  °If you have a dressing on your incision, you may remove it three days after your surgery. Keep your incision area clean and dry. ° °If you have staples or stitches on your incision, you   should have a follow up scheduled for removal. If you do not have staples or stitches, you will have steri-strips (small pieces of surgical tape) or Dermabond glue. The steri-strips/glue should begin to peel away within about a week (it is fine if the steri-strips fall off before then). If the strips are still in place one week after your surgery, you may gently remove them. ° °Diet          ° ° You may return to your usual diet. Be sure to stay hydrated. ° °When to Contact Us ° °Although your surgery and recovery will likely be uneventful, you may have some residual numbness, aches, and pains in your back and/or legs. This is normal and should improve in the next few  weeks. ° °However, should you experience any of the following, contact us immediately: °• New numbness or weakness °• Pain that is progressively getting worse, and is not relieved by your pain medications or rest °• Bleeding, redness, swelling, pain, or drainage from surgical incision °• Chills or flu-like symptoms °• Fever greater than 101.0 F (38.3 C) °• Problems with bowel or bladder functions °• Difficulty breathing or shortness of breath °• Warmth, tenderness, or swelling in your calf ° °Contact Information °• During office hours (Monday-Friday 9 am to 5 pm), please call your physician at [phone] °• After hours and weekends, please call the [facility] Operator at [phone] and ask for the Neurosurgery Resident On Call  °• For a life-threatening emergency, call 911 ° ° ° °

## 2017-06-14 NOTE — Op Note (Signed)
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  Findings: disc herniation at L5-S1, facet arthropathy at L4-5  Preoperative Diagnosis: Lumbar radiculopathy Postoperative Diagnosis: same   EBL: 25 ml IVF: 750 ml Drains: none Disposition: Extubated and Stable to PACU Complications: none  No foley catheter was placed.   Preoperative Note:   Risks of surgery discussed include: infection, bleeding, stroke, coma, death, paralysis, CSF leak, nerve/spinal cord injury, numbness, tingling, weakness, complex regional pain syndrome, recurrent stenosis and/or disc herniation, vascular injury, development of instability, neck/back pain, need for further surgery, persistent symptoms, development of deformity, and the risks of anesthesia. The patient understood these risks and have agreed to proceed.  Operative Note:    The patient was then brought from the preoperative center with intravenous access established.  The patient underwent general anesthesia and endotracheal tube intubation, and was then rotated on the [place] rail top where all pressure points were appropriately padded.  The skin was then thoroughly cleansed.  Perioperative antibiotic prophylaxis was administered.  Sterile prep and drapes were then applied and a timeout was then observed.  C-arm was brought into the field under sterile conditions, and the L4-5 and L5-S1 disc spaces identified and marked with an incision on the right 1cm lateral to midline.  Once this was complete a 4 cm incision was opened with the use of a #10 blade knife.  The metrics tubes were sequentially advanced under lateral fluoroscopy until a 18 x 90 mm Metrix tube was placed over the facet and lamina and secured to the bed at the L5-S1 level.    The microscope was then sterilely brought into the field and muscle creep was hemostased with a bipolar and  resected with a pituitary rongeur.  A Bovie extender was then used to expose the spinous process and lamina.  Careful attention was placed to not violate the facet capsule. A 3 mm matchstick drill bit was then used to make a hemi-laminotomy trough until the ligamentum flavum was exposed.  This was extended to the base of the spinous process.  Once this was complete and the underlying ligamentum flavum was visualized this was dissected with an up angle curette and resected with a #2 and #3 mm biting Kerrison.  The laminotomy opening was also expanded in similar fashion and hemostasis was obtained with Surgifoam and a patty as well as bone wax.  The rostral aspect of the caudal level of the lamina was also resected with a #2 biting Kerrison effort to further enhance exposure.  Once the underlying dura was visualized a Penfield 4 was then used to dissect and expose the traversing nerve root.  Once this was identified a nerve root retractor suction was used to mobilize this medially.  The venous plexus was hemostased with Surgifoam and light bipolar use.  #15 blade knife was then used to make a small annulotomy within the disc space and disc space contents were noted to to come through the annulus.    The disc herniation was identified and dissected free using a balltip probe. The pituitary rongeur was used to remove the extruded disc fragments. Once the thecal sac and nerve root were noted to be relaxed and under less tension the ball-tipped feeler was passed along the foramen distally to to ensure no residual compression was noted.    A Depo-Medrol soaked Gelfoam pledget was placed along the nerve root for  2 minutes and removed.  The area was irrigated. The tube system was then removed under microscopic visualization and hemostasis was obtained with a bipolar.    We then turned attention to L4-5. The metrx tubes were sequentially advanced and confirmed in position. An 18mm by 90mm tube was locked in place to the  bed side attachment.  Fluoroscopy was then removed from the field.  The microscope was then sterilely brought into the field and muscle creep was hemostased with a bipolar and resected with a pituitary rongeur.  A Bovie extender was then used to expose the spinous process and lamina.  Careful attention was placed to not violate the facet capsule. A 3 mm matchstick drill bit was then used to make a hemi-laminotomy trough until the ligamentum flavum was exposed.  This was extended to the base of the spinous process and to the contralateral side to remove all the central bone from each side.  Once this was complete and the underlying ligamentum flavum was visualized, it was dissected with a curette and resected with Kerrison rongeurs.  Extensive ligamentum hypertrophy was noted, requiring a substantial amount of time and care for removal.  The dura was identified and palpated. The kerrison rongeur was then used to remove the medial facet bilaterally until no compression was noted.  A balltip probe was used to confirm decompression of the right L5 nerve root.  Additional attention was paid to completion of the contralateral L4-5 foraminotomy until the left L5 nerve root was completely free.  Once this was complete, L4-5 central decompression including medial facetectomy and foraminotomy was confirmed and decompression on both sides was confirmed. No CSF leak was noted.  The disc was inspected and no frank herniation was compressing the nerve root, so laminoforaminotomy was performed rather than microdiscectomy.  A Depo-Medrol soaked Gelfoam pledget was placed in the defect.  The wound was copiously irrigated. The tube system was then removed under microscopic visualization and hemostasis was obtained with a bipolar.  The fascial layer was reapproximated with the use of a 0- Vicryl suture.  Subcutaneous tissue layer was reapproximated using 2-0 Vicryl suture.  3-0 monocryl was used on the skin. The skin was then  cleansed and Dermabond was used to close the skin opening.  Patient was then rotated back to the preoperative bed awakened from anesthesia and taken to recovery all counts are correct in this case.   I performed the entire procedure with the assistance of Ivar DrapeAmanda Ferri PA as an Designer, television/film setassistant surgeon.  Venetia Nighthester Micheal Sheen MD

## 2017-06-14 NOTE — Transfer of Care (Signed)
Immediate Anesthesia Transfer of Care Note  Patient: Michelle Fitzgerald  Procedure(s) Performed: LUMBAR LAMINECTOMY/DECOMPRESSION MICRODISCECTOMY 2 LEVELS-L4-5,L5-S1 (N/A Spine Lumbar)  Patient Location: PACU  Anesthesia Type:General  Level of Consciousness: awake, alert , oriented and patient cooperative  Airway & Oxygen Therapy: Patient Spontanous Breathing and Patient connected to face mask oxygen  Post-op Assessment: Report given to RN and Post -op Vital signs reviewed and stable  Post vital signs: Reviewed and stable  Last Vitals:  Vitals:   06/14/17 0613 06/14/17 0958  BP: (!) 133/93 (P) 133/78  Pulse: 91   Resp: 16   Temp: 36.5 C (P) 36.8 C  SpO2: 96%     Last Pain:  Vitals:   06/14/17 0613  TempSrc: Oral  PainSc: 4          Complications: No apparent anesthesia complications

## 2017-06-14 NOTE — Progress Notes (Signed)
Michelle Fitzgerald was seen in PACU following L4/5 decompression and L5/S1 microdiscectomy. Patient still disoriented due to anesthesia. Able to move all extremities. Strength and sensation intact upper and lower extremities. Patient scheduled to follow up in clinic in 2 weeks to monitor progress.    Ivar DrapeAmanda Irvin Bastin PA-C Department of Neurosurgery

## 2017-06-14 NOTE — Anesthesia Post-op Follow-up Note (Signed)
Anesthesia QCDR form completed.        

## 2017-06-14 NOTE — Anesthesia Postprocedure Evaluation (Signed)
Anesthesia Post Note  Patient: Michelle Fitzgerald  Procedure(s) Performed: LUMBAR LAMINECTOMY/DECOMPRESSION MICRODISCECTOMY 2 LEVELS-L4-5,L5-S1 (N/A Spine Lumbar)  Patient location during evaluation: PACU Anesthesia Type: General Level of consciousness: awake and alert and oriented Pain management: pain level controlled Vital Signs Assessment: post-procedure vital signs reviewed and stable Respiratory status: spontaneous breathing Cardiovascular status: blood pressure returned to baseline Anesthetic complications: no     Last Vitals:  Vitals:   06/14/17 1049 06/14/17 1129  BP: 122/81 127/74  Pulse: 66 72  Resp: 16 16  Temp: 36.7 C   SpO2: 98% 99%    Last Pain:  Vitals:   06/14/17 1129  TempSrc:   PainSc: 3                  Ori Kreiter

## 2017-10-23 LAB — HM HIV SCREENING LAB: HM HIV Screening: NEGATIVE

## 2018-12-21 IMAGING — MR MR LUMBAR SPINE W/O CM
4 of 5 series · 19 of 48 positions shown · non-contrast
Comparison: None.

CLINICAL DATA: Low back pain shooting into the right leg and foot
with numbness and tingling for 2 weeks. No known injury.

EXAM:
MRI LUMBAR SPINE WITHOUT CONTRAST
TECHNIQUE: Multiplanar, multisequence MR imaging of the lumbar spine was
performed. No intravenous contrast was administered.

[Series 2: T2 · sagittal · 4.0mm · 0.55mm/px · 6 of 17 slices shown (1 of 2)]
[im 1/17]
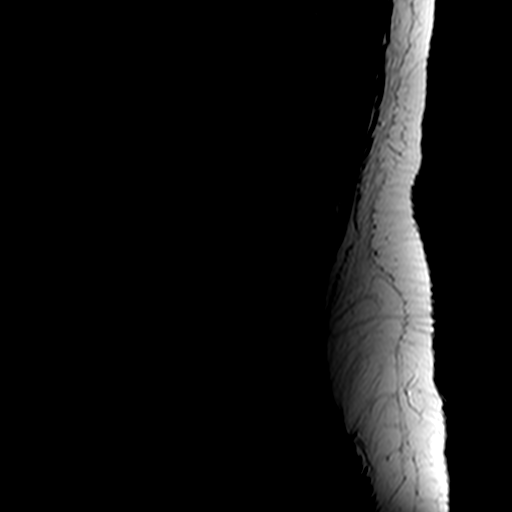
[im 4/17]
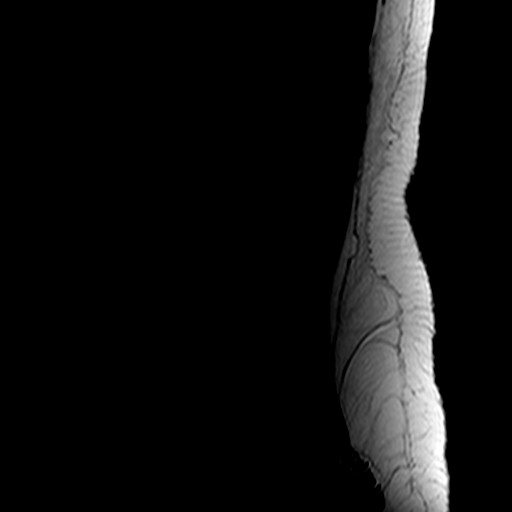
[im 7/17]
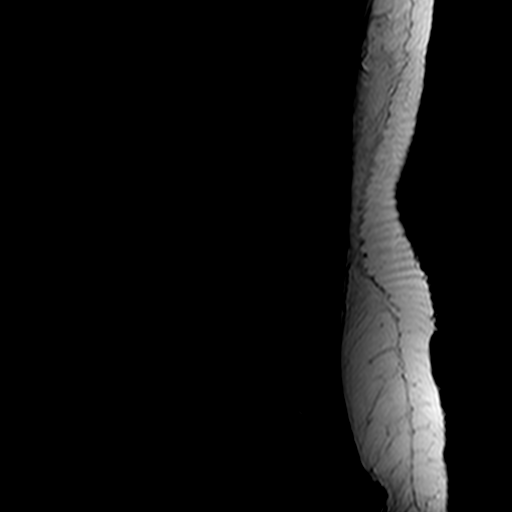
[im 10/17]
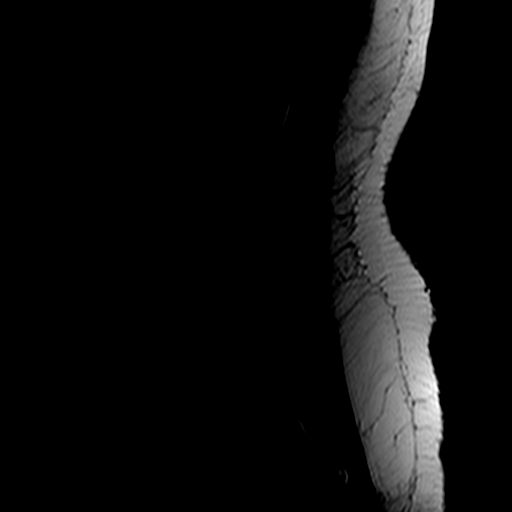
[im 13/17]
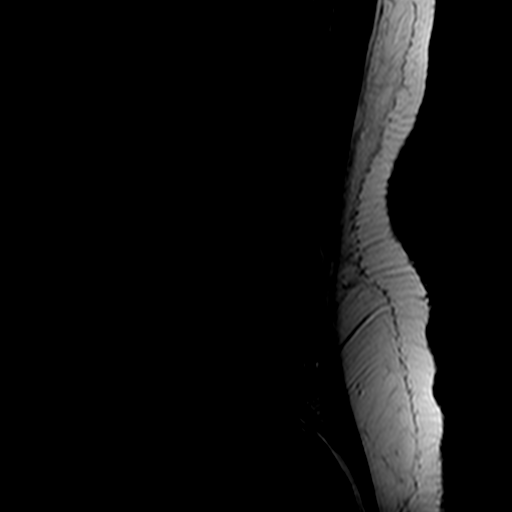
[im 17/17]
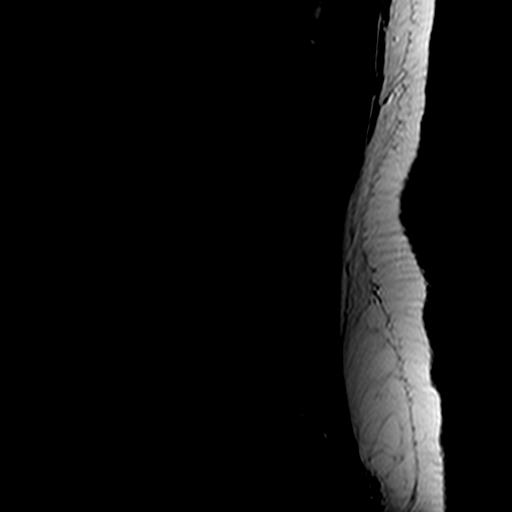

[Series 3: T1 · sagittal · 4.0mm · 0.55mm/px · 3 of 17 slices shown (1 of 2)]
[im 4/17]
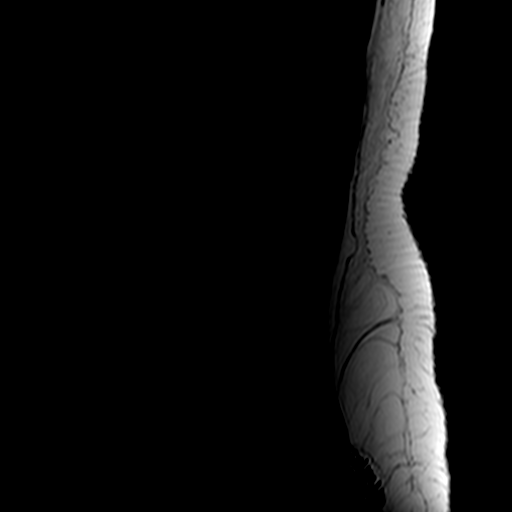
[im 10/17]
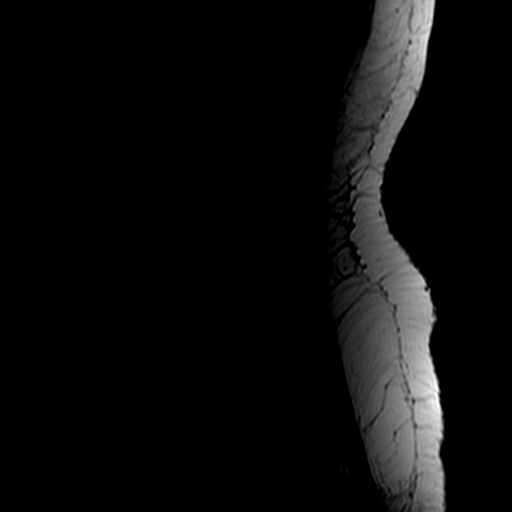
[im 17/17]
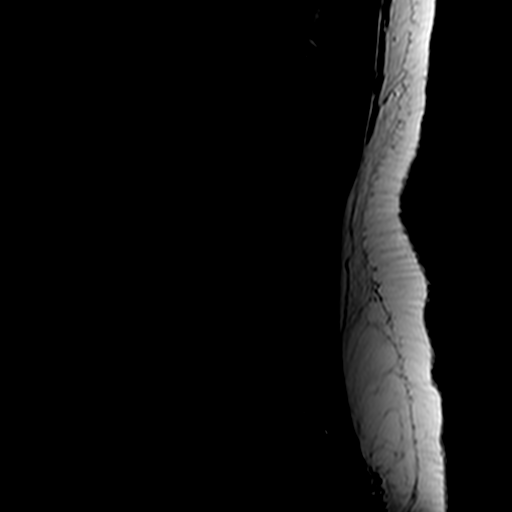

[Series 6: T1 · axial · 4.0mm · 0.39mm/px · z∈[-107,+60]mm · 3 of 39 slices shown (2 of 2)]
[im 6/39]
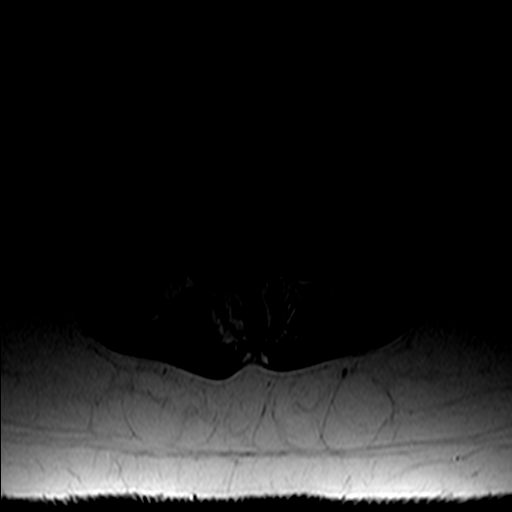
[im 20/39]
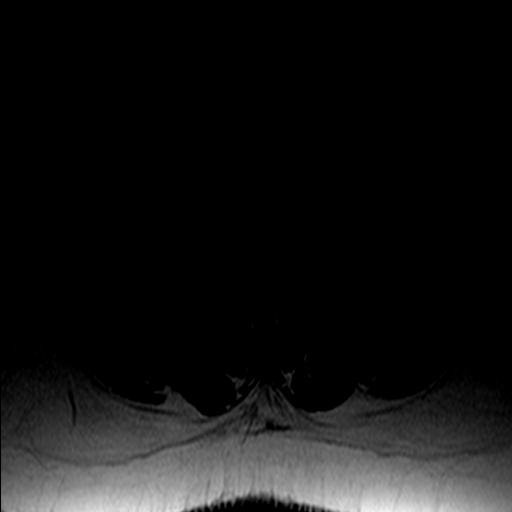
[im 33/39]
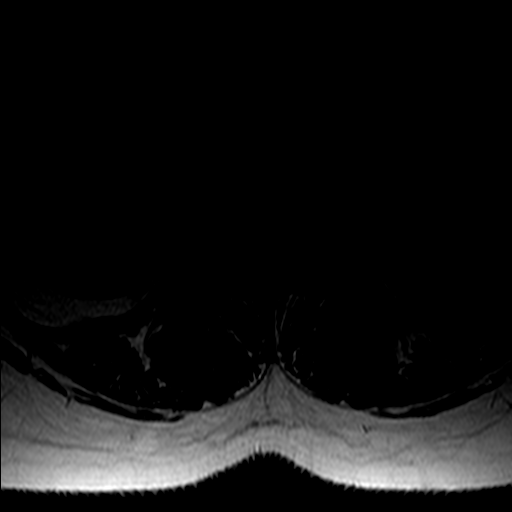

[Series 7: T2 · axial · 4.0mm · 0.39mm/px · z∈[-132,+60]mm · 7 of 39 slices shown (2 of 2)]
[im 1/39]
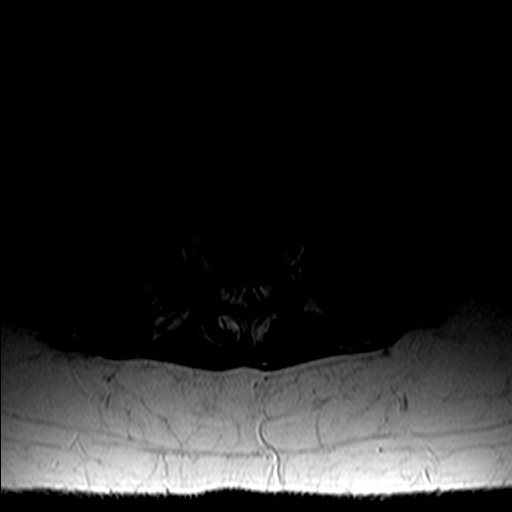
[im 6/39]
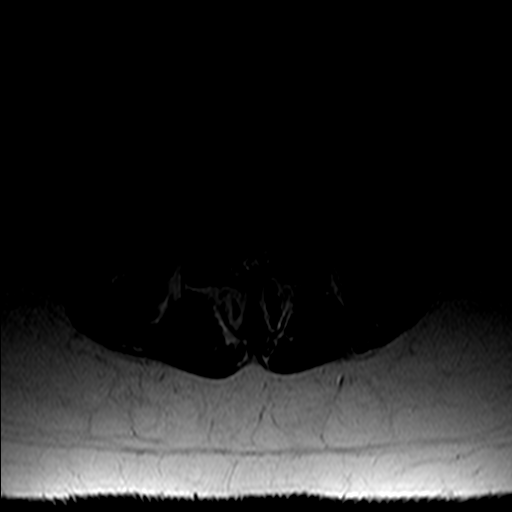
[im 11/39]
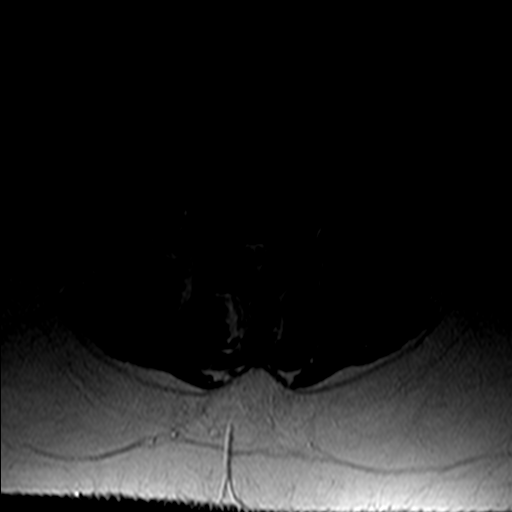
[im 17/39]
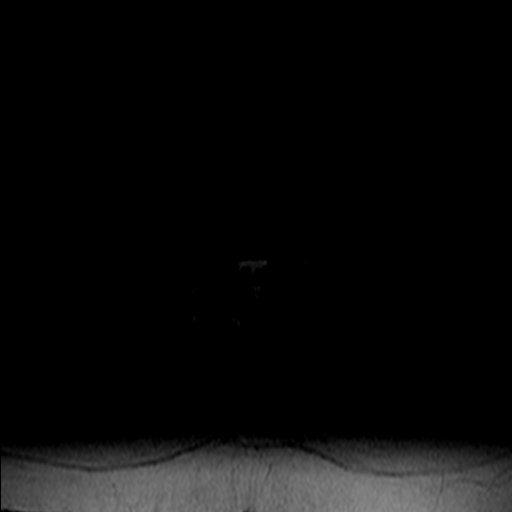
[im 20/39]
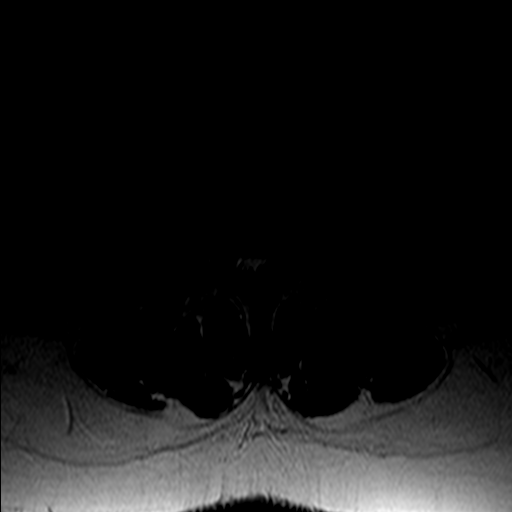
[im 22/39]
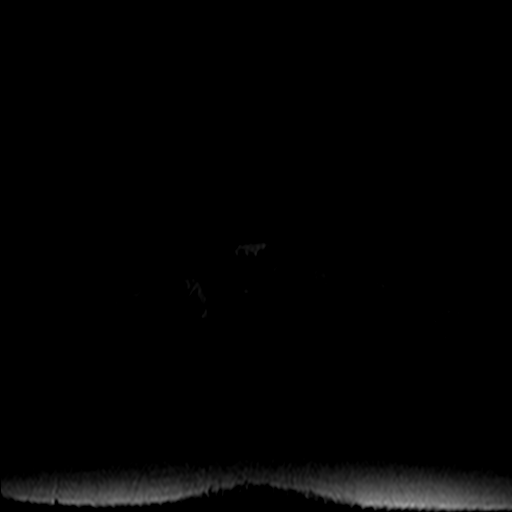
[im 33/39]
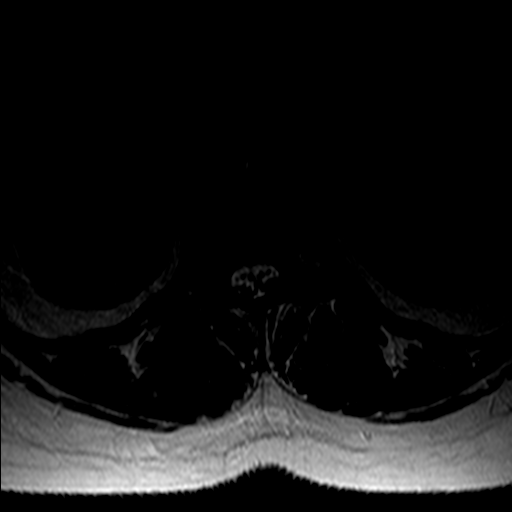

[19 of 48 positions shown; findings below may reference images not displayed]

FINDINGS: Segmentation:  Standard.

Alignment:  Maintained.

Vertebrae:  No fracture or worrisome lesion.

Conus medullaris and cauda equina: Conus extends to the L1 Level.
Conus and cauda equina appear normal.

Paraspinal and other soft tissues: Negative.

Disc levels:

T11-12 is imaged in the sagittal plane only and negative.

T12-L1:  Negative.

L1-2:  Negative.

L2-3:  Negative.

L3-4: Shallow disc bulge without central canal or foraminal
stenosis.

L4-5: Broad-based central protrusion with an annular fissure causes
some narrowing in the lateral recesses which could impact either
descending L5 root, worse on the right. There is mild central canal
stenosis overall. The foramina are open.

L5-S1: The patient has a very large right paracentral protrusion.
The disc compresses the descending right S1 root in the lateral
recess. It also causes a mild degree of left lateral recess
narrowing. Moderately severe bilateral foraminal narrowing is also
identified and could impact either exiting L5 root.
IMPRESSION: Large right paracentral protrusion at L5-S1 compresses the
descending right S1 root in the lateral recess and mildly encroaches
on the descending left S1 root. There is moderately severe bilateral
foraminal narrowing at this level.

Annular fissure with a broad-based central protrusion at L4-5
results in narrowing in the lateral recesses which could impact
either descending L5 root, worse on the right.

## 2019-01-01 ENCOUNTER — Emergency Department: Payer: Managed Care, Other (non HMO) | Admitting: Anesthesiology

## 2019-01-01 ENCOUNTER — Emergency Department: Payer: Managed Care, Other (non HMO)

## 2019-01-01 ENCOUNTER — Encounter: Payer: Self-pay | Admitting: Emergency Medicine

## 2019-01-01 ENCOUNTER — Emergency Department
Admission: EM | Admit: 2019-01-01 | Discharge: 2019-01-02 | Disposition: A | Discharge status: Home / Self Care | Payer: Managed Care, Other (non HMO) | Attending: Emergency Medicine | Admitting: Emergency Medicine

## 2019-01-01 ENCOUNTER — Encounter
Admission: EM | Disposition: A | Discharge status: Home / Self Care | Payer: Self-pay | Source: Home / Self Care | Attending: Emergency Medicine

## 2019-01-01 ENCOUNTER — Other Ambulatory Visit: Payer: Self-pay

## 2019-01-01 DIAGNOSIS — Z3A00 Weeks of gestation of pregnancy not specified: Secondary | ICD-10-CM | POA: Insufficient documentation

## 2019-01-01 DIAGNOSIS — K661 Hemoperitoneum: Secondary | ICD-10-CM

## 2019-01-01 DIAGNOSIS — O008 Other ectopic pregnancy without intrauterine pregnancy: Secondary | ICD-10-CM | POA: Insufficient documentation

## 2019-01-01 DIAGNOSIS — F17210 Nicotine dependence, cigarettes, uncomplicated: Secondary | ICD-10-CM | POA: Insufficient documentation

## 2019-01-01 DIAGNOSIS — Z515 Encounter for palliative care: Secondary | ICD-10-CM

## 2019-01-01 DIAGNOSIS — Z20828 Contact with and (suspected) exposure to other viral communicable diseases: Secondary | ICD-10-CM | POA: Diagnosis not present

## 2019-01-01 DIAGNOSIS — O26891 Other specified pregnancy related conditions, first trimester: Secondary | ICD-10-CM | POA: Diagnosis present

## 2019-01-01 DIAGNOSIS — O00101 Right tubal pregnancy without intrauterine pregnancy: Secondary | ICD-10-CM

## 2019-01-01 HISTORY — PX: DIAGNOSTIC LAPAROSCOPY WITH REMOVAL OF ECTOPIC PREGNANCY: SHX6449

## 2019-01-01 LAB — COMPREHENSIVE METABOLIC PANEL
ALT: 38 U/L (ref 0–44)
AST: 24 U/L (ref 15–41)
Albumin: 3.8 g/dL (ref 3.5–5.0)
Alkaline Phosphatase: 52 U/L (ref 38–126)
Anion gap: 9 (ref 5–15)
BUN: 12 mg/dL (ref 6–20)
CO2: 23 mmol/L (ref 22–32)
Calcium: 9.2 mg/dL (ref 8.9–10.3)
Chloride: 105 mmol/L (ref 98–111)
Creatinine, Ser: 0.79 mg/dL (ref 0.44–1.00)
GFR calc Af Amer: >60 mL/min (ref >60)
GFR calc non Af Amer: >60 mL/min (ref >60)
Glucose, Bld: 103 mg/dL — ABNORMAL HIGH (ref 70–99)
Potassium: 4 mmol/L (ref 3.5–5.1)
Sodium: 137 mmol/L (ref 135–145)
Total Bilirubin: 0.6 mg/dL (ref 0.3–1.2)
Total Protein: 6.6 g/dL (ref 6.5–8.1)

## 2019-01-01 LAB — CBC
HCT: 37.9 % (ref 36.0–46.0)
Hemoglobin: 12.5 g/dL (ref 12.0–15.0)
MCH: 29.2 pg (ref 26.0–34.0)
MCHC: 33 g/dL (ref 30.0–36.0)
MCV: 88.6 fL (ref 80.0–100.0)
Platelets: 380 10*3/uL (ref 150–400)
RBC: 4.28 MIL/uL (ref 3.87–5.11)
RDW: 12.6 % (ref 11.5–15.5)
WBC: 15.6 10*3/uL — ABNORMAL HIGH (ref 4.0–10.5)
nRBC: 0 % (ref 0.0–0.2)

## 2019-01-01 LAB — TYPE AND SCREEN
ABO/RH(D): A POS
Antibody Screen: NEGATIVE

## 2019-01-01 LAB — URINALYSIS, COMPLETE (UACMP) WITH MICROSCOPIC
Bacteria, UA: NONE SEEN
Bilirubin Urine: NEGATIVE
Glucose, UA: NEGATIVE mg/dL
Ketones, ur: NEGATIVE mg/dL
Leukocytes,Ua: NEGATIVE
Nitrite: NEGATIVE
Protein, ur: NEGATIVE mg/dL
Specific Gravity, Urine: 1.021 (ref 1.005–1.030)
pH: 5 (ref 5.0–8.0)

## 2019-01-01 LAB — POCT PREGNANCY, URINE: Preg Test, Ur: POSITIVE — AB

## 2019-01-01 LAB — SARS CORONAVIRUS 2 BY RT PCR (HOSPITAL ORDER, PERFORMED IN ~~LOC~~ HOSPITAL LAB): SARS Coronavirus 2: NEGATIVE

## 2019-01-01 LAB — HCG, QUANTITATIVE, PREGNANCY: hCG, Beta Chain, Quant, S: 12354 m[IU]/mL — ABNORMAL HIGH (ref <5)

## 2019-01-01 LAB — LIPASE, BLOOD: Lipase: 17 U/L (ref 11–51)

## 2019-01-01 SURGERY — LAPAROSCOPY, WITH ECTOPIC PREGNANCY SURGICAL TREATMENT
Anesthesia: General

## 2019-01-01 MED ORDER — SUCCINYLCHOLINE CHLORIDE 20 MG/ML IJ SOLN
INTRAMUSCULAR | Status: AC
  Filled 2019-01-01: qty 1

## 2019-01-01 MED ORDER — MIDAZOLAM HCL 2 MG/2ML IJ SOLN
INTRAMUSCULAR | Status: AC
  Filled 2019-01-01: qty 2

## 2019-01-01 MED ORDER — BUPIVACAINE HCL (PF) 0.5 % IJ SOLN
INTRAMUSCULAR | Status: AC
  Filled 2019-01-01: qty 30

## 2019-01-01 MED ORDER — GLYCOPYRROLATE 0.2 MG/ML IJ SOLN
INTRAMUSCULAR | Status: DC | PRN
  Administered 2019-01-01: 0.2 mg via INTRAVENOUS

## 2019-01-01 MED ORDER — ONDANSETRON HCL 4 MG/2ML IJ SOLN
INTRAMUSCULAR | Status: DC | PRN
  Administered 2019-01-01: 4 mg via INTRAVENOUS
  Administered 2019-01-01: 25 mg via INTRAVENOUS

## 2019-01-01 MED ORDER — DEXAMETHASONE SODIUM PHOSPHATE 10 MG/ML IJ SOLN
INTRAMUSCULAR | Status: DC | PRN
  Administered 2019-01-01: 6 mg via INTRAVENOUS

## 2019-01-01 MED ORDER — LIDOCAINE HCL (CARDIAC) PF 100 MG/5ML IV SOSY
PREFILLED_SYRINGE | INTRAVENOUS | Status: DC | PRN
  Administered 2019-01-01: 60 mg via INTRAVENOUS

## 2019-01-01 MED ORDER — MORPHINE SULFATE (PF) 2 MG/ML IV SOLN
2.0000 mg | Freq: Once | INTRAVENOUS | Status: AC
  Administered 2019-01-01: 2 mg via INTRAVENOUS
  Filled 2019-01-01: qty 1

## 2019-01-01 MED ORDER — LIDOCAINE HCL (PF) 2 % IJ SOLN
INTRAMUSCULAR | Status: AC
  Filled 2019-01-01: qty 10

## 2019-01-01 MED ORDER — DEXAMETHASONE SODIUM PHOSPHATE 10 MG/ML IJ SOLN
INTRAMUSCULAR | Status: AC
  Filled 2019-01-01: qty 1

## 2019-01-01 MED ORDER — PROPOFOL 10 MG/ML IV BOLUS
INTRAVENOUS | Status: DC | PRN
  Administered 2019-01-01: 200 mg via INTRAVENOUS

## 2019-01-01 MED ORDER — PROPOFOL 10 MG/ML IV BOLUS
INTRAVENOUS | Status: AC
  Filled 2019-01-01: qty 20

## 2019-01-01 MED ORDER — SODIUM CHLORIDE 0.9 % IV BOLUS
1000.0000 mL | Freq: Once | INTRAVENOUS | Status: AC
  Administered 2019-01-01: 1000 mL via INTRAVENOUS

## 2019-01-01 MED ORDER — ONDANSETRON HCL 4 MG/2ML IJ SOLN
INTRAMUSCULAR | Status: AC
  Filled 2019-01-01: qty 2

## 2019-01-01 MED ORDER — ROCURONIUM BROMIDE 100 MG/10ML IV SOLN
INTRAVENOUS | Status: DC | PRN
  Administered 2019-01-01: 20 mg via INTRAVENOUS
  Administered 2019-01-01 – 2019-01-02 (×3): 10 mg via INTRAVENOUS

## 2019-01-01 MED ORDER — SUCCINYLCHOLINE CHLORIDE 20 MG/ML IJ SOLN
INTRAMUSCULAR | Status: DC | PRN
  Administered 2019-01-01: 100 mg via INTRAVENOUS

## 2019-01-01 MED ORDER — ROCURONIUM BROMIDE 50 MG/5ML IV SOLN
INTRAVENOUS | Status: AC
  Filled 2019-01-01: qty 1

## 2019-01-01 MED ORDER — FENTANYL CITRATE (PF) 100 MCG/2ML IJ SOLN
INTRAMUSCULAR | Status: AC
  Filled 2019-01-01: qty 2

## 2019-01-01 MED ORDER — MIDAZOLAM HCL 5 MG/5ML IJ SOLN
INTRAMUSCULAR | Status: DC | PRN
  Administered 2019-01-01: 2 mg via INTRAVENOUS

## 2019-01-01 MED ORDER — FENTANYL CITRATE (PF) 100 MCG/2ML IJ SOLN
INTRAMUSCULAR | Status: DC | PRN
  Administered 2019-01-01: 100 ug via INTRAVENOUS
  Administered 2019-01-02 (×2): 50 ug via INTRAVENOUS

## 2019-01-01 MED ORDER — ONDANSETRON HCL 4 MG/2ML IJ SOLN
4.0000 mg | Freq: Once | INTRAMUSCULAR | Status: AC
  Administered 2019-01-01: 4 mg via INTRAVENOUS
  Filled 2019-01-01: qty 2

## 2019-01-01 SURGICAL SUPPLY — 46 items
ANCHOR TIS RET SYS 235ML (MISCELLANEOUS) ×3 IMPLANT
BAG DECANTER FOR FLEXI CONT (MISCELLANEOUS) ×3 IMPLANT
BLADE SURG 15 STRL LF DISP TIS (BLADE) ×1 IMPLANT
BLADE SURG 15 STRL SS (BLADE) ×2
BLADE SURG SZ11 CARB STEEL (BLADE) ×3 IMPLANT
CANISTER SUCT 1200ML W/VALVE (MISCELLANEOUS) ×3 IMPLANT
CATH ROBINSON RED A/P 16FR (CATHETERS) ×3 IMPLANT
CHLORAPREP W/TINT 26 (MISCELLANEOUS) ×3 IMPLANT
CLOSURE WOUND 1/2 X4 (GAUZE/BANDAGES/DRESSINGS) ×1
COVER WAND RF STERILE (DRAPES) IMPLANT
ELECT REM PT RETURN 9FT ADLT (ELECTROSURGICAL) ×3
ELECTRODE REM PT RTRN 9FT ADLT (ELECTROSURGICAL) ×1 IMPLANT
GLOVE BIOGEL PI ORTHO PRO 7.5 (GLOVE) ×2
GLOVE PI ORTHO PRO STRL 7.5 (GLOVE) ×1 IMPLANT
GLOVE SURG SYN 8.0 (GLOVE) ×3 IMPLANT
GOWN STRL REUS W/ TWL LRG LVL3 (GOWN DISPOSABLE) ×2 IMPLANT
GOWN STRL REUS W/TWL LRG LVL3 (GOWN DISPOSABLE) ×4
GOWN STRL REUS W/TWL XL LVL4 (GOWN DISPOSABLE) ×3 IMPLANT
IRRIGATION STRYKERFLOW (MISCELLANEOUS) ×1 IMPLANT
IRRIGATOR STRYKERFLOW (MISCELLANEOUS) ×3
IV LACTATED RINGERS 1000ML (IV SOLUTION) ×3 IMPLANT
KIT PINK PAD W/HEAD ARE REST (MISCELLANEOUS) ×3
KIT PINK PAD W/HEAD ARM REST (MISCELLANEOUS) ×1 IMPLANT
KIT TURNOVER CYSTO (KITS) ×3 IMPLANT
LIGASURE LAP MARYLAND 5MM 37CM (ELECTROSURGICAL) ×3 IMPLANT
NEEDLE HYPO 22GX1.5 SAFETY (NEEDLE) ×3 IMPLANT
NS IRRIG 500ML POUR BTL (IV SOLUTION) ×3 IMPLANT
PACK GYN LAPAROSCOPIC (MISCELLANEOUS) ×3 IMPLANT
PAD OB MATERNITY 4.3X12.25 (PERSONAL CARE ITEMS) ×3 IMPLANT
PAD PREP 24X41 OB/GYN DISP (PERSONAL CARE ITEMS) ×3 IMPLANT
POUCH SPECIMEN RETRIEVAL 10MM (ENDOMECHANICALS) ×3 IMPLANT
SCISSORS METZENBAUM CVD 33 (INSTRUMENTS) ×3 IMPLANT
SET TUBE SMOKE EVAC HIGH FLOW (TUBING) ×3 IMPLANT
SLEEVE ENDOPATH XCEL 5M (ENDOMECHANICALS) ×3 IMPLANT
SLEEVE SCD COMPRESS THIGH MED (MISCELLANEOUS) ×3 IMPLANT
STRIP CLOSURE SKIN 1/2X4 (GAUZE/BANDAGES/DRESSINGS) ×2 IMPLANT
SUT VICRYL 0 AB UR-6 (SUTURE) ×3 IMPLANT
SUT VICRYL 4-0  27 PS-2 BARIAT (SUTURE) ×2
SUT VICRYL 4-0 27 PS-2 BARIAT (SUTURE) ×1
SUTURE VICRYL 4-0 27 PS-2 BART (SUTURE) ×1 IMPLANT
TOWEL OR 17X26 4PK STRL BLUE (TOWEL DISPOSABLE) ×3 IMPLANT
TROCAR ENDO BLADELESS 11MM (ENDOMECHANICALS) IMPLANT
TROCAR XCEL NON-BLD 5MMX100MML (ENDOMECHANICALS) ×3 IMPLANT
TROCAR XCEL UNIV SLVE 11M 100M (ENDOMECHANICALS) ×3 IMPLANT
TUBING CONNECTING 10 (TUBING) ×2 IMPLANT
TUBING CONNECTING 10' (TUBING) ×1

## 2019-01-01 NOTE — ED Notes (Signed)
Lab states they will start working on hcg they did not see add-on

## 2019-01-01 NOTE — Anesthesia Preprocedure Evaluation (Signed)
Anesthesia Evaluation  Patient identified by MRN, date of birth, ID band Patient awake    Reviewed: Allergy & Precautions, NPO status , Patient's Chart, lab work & pertinent test results  History of Anesthesia Complications Negative for: history of anesthetic complications  Airway Mallampati: I  TM Distance: <3 FB     Dental  (+) Teeth Intact, Dental Advidsory Given   Pulmonary neg shortness of breath, neg COPD, neg recent URI, Current Smoker and Patient abstained from smoking.,    Pulmonary exam normal        Cardiovascular negative cardio ROS Normal cardiovascular exam     Neuro/Psych negative neurological ROS  negative psych ROS   GI/Hepatic Neg liver ROS, GERD  ,  Endo/Other  neg diabetesMorbid obesity  Renal/GU negative Renal ROS  negative genitourinary   Musculoskeletal  (+) Arthritis , Osteoarthritis,    Abdominal Normal abdominal exam  (+)   Peds negative pediatric ROS (+)  Hematology negative hematology ROS (+)   Anesthesia Other Findings History reviewed. No pertinent past medical history.   Reproductive/Obstetrics                             Anesthesia Physical  Anesthesia Plan  ASA: III  Anesthesia Plan: General   Post-op Pain Management:    Induction: Intravenous  PONV Risk Score and Plan: 2 and Ondansetron, Dexamethasone, Midazolam, Promethazine and Treatment may vary due to age or medical condition  Airway Management Planned: Oral ETT  Additional Equipment:   Intra-op Plan:   Post-operative Plan: Extubation in OR  Informed Consent: I have reviewed the patients History and Physical, chart, labs and discussed the procedure including the risks, benefits and alternatives for the proposed anesthesia with the patient or authorized representative who has indicated his/her understanding and acceptance.     Dental advisory given  Plan Discussed with: CRNA and  Surgeon  Anesthesia Plan Comments:         Anesthesia Quick Evaluation

## 2019-01-01 NOTE — ED Provider Notes (Signed)
[facility] Emergency Department Provider Note    First MD Initiated Contact with Patient 01/01/19 1921     (approximate)  I have reviewed the triage vital signs and the nursing notes.   HISTORY  Chief Complaint Abdominal Pain and Pelvic Pain   HPI Michelle Fitzgerald is a 33 y.o. female G4 P0 (3 previous elective abortions) with below list of previous medical conditions including "miscarriage last week".  Patient states last menstrual cycle was June 8.  She states that she had a positive pregnancy test at home however she did not see OB/GYN during the pregnancy.  Patient states that she assumed it was a miscarriage secondary to passing a large clot presents to the emergency department secondary to 10 out of 10 lower abdominal/pelvic pain with associated nausea and vomiting which patient states is been occurring since 2:30 AM this morning.  Patient does admit to current vaginal bleeding        History reviewed. No pertinent past medical history.  There are no active problems to display for this patient.   Past Surgical History:  Procedure Laterality Date   FINGER TENDON REPAIR Right    right middle finger   LUMBAR LAMINECTOMY/DECOMPRESSION MICRODISCECTOMY N/A 06/14/2017   Procedure: LUMBAR LAMINECTOMY/DECOMPRESSION MICRODISCECTOMY 2 LEVELS-L4-5,L5-S1;  Surgeon: Venetia NightYarbrough, [place], MD;  Location: [facility] ORS;  Service: Neurosurgery;  Laterality: N/A;   TONSILLECTOMY      Prior to Admission medications   Medication Sig Start Date End Date Taking? Authorizing Provider  celecoxib (CELEBREX) 200 MG capsule Take 200 mg by mouth 2 (two) times daily.    [provider]  methocarbamol (ROBAXIN) 500 MG tablet Take 1 tablet (500 mg total) by mouth 3 (three) times daily. 06/14/17   Ivar DrapeFerri, Amanda, PA-C  oxyCODONE (ROXICODONE) 5 MG immediate release tablet Take 1 tablet (5 mg total) by mouth every 4 (four) hours as needed for breakthrough pain. 06/14/17   Ivar DrapeFerri,  Amanda, PA-C    Allergies Patient has no known allergies.  Family History  Problem Relation Age of Onset   Diabetes Father    Diabetes Maternal Grandmother    Cancer Maternal Grandmother    Hypertension Maternal Grandmother     Social History Social History   Tobacco Use   Smoking status: Current Some Day Smoker    Packs/day: 1.00    Types: Cigarettes   Smokeless tobacco: Never Used  Substance Use Topics   Alcohol use: No   Drug use: No    Review of Systems Constitutional: No fever/chills Eyes: No visual changes. ENT: No sore throat. Cardiovascular: Denies chest pain. Respiratory: Denies shortness of breath. Gastrointestinal: No abdominal pain.  No nausea, no vomiting.  No diarrhea.  No constipation. Genitourinary: Negative for dysuria. Musculoskeletal: Negative for neck pain.  Negative for back pain. Integumentary: Negative for rash. Neurological: Negative for headaches, focal weakness or numbness.  ____________________________________________   PHYSICAL EXAM:  VITAL SIGNS: ED Triage Vitals [01/01/19 1647]  Enc Vitals Group     BP 120/80     Pulse Rate 100     Resp 16     Temp 99.3 F (37.4 C)     Temp Source Oral     SpO2 99 %     Weight 113.4 kg (250 lb)     Height 1.6 m (5\' 3" )     Head Circumference      Peak Flow      Pain Score 4     Pain Loc  Pain Edu?      Excl. in [place]?     Constitutional: Alert and oriented.  Apparent discomfort Eyes: Conjunctivae are normal.  Mouth/Throat: Mucous membranes are moist. Neck: No stridor.  No meningeal signs.   Cardiovascular: Normal rate, regular rhythm. Good peripheral circulation. Grossly normal heart sounds. Respiratory: Normal respiratory effort.  No retractions. Gastrointestinal: Right lower quadrant/left lower quadrant tenderness to palpation. No distention.  Genitourinary: Deferred Musculoskeletal: No lower extremity tenderness nor edema. No gross deformities of  extremities. Neurologic:  Normal speech and language. No gross focal neurologic deficits are appreciated.  Skin:  Skin is warm, dry and intact. Psychiatric: Mood and affect are normal. Speech and behavior are normal.  ____________________________________________   LABS (all labs ordered are listed, but only abnormal results are displayed)  Labs Reviewed  COMPREHENSIVE METABOLIC PANEL - Abnormal; Notable for the following components:      Result Value   Glucose, Bld 103 (*)    All other components within normal limits  CBC - Abnormal; Notable for the following components:   WBC 15.6 (*)    All other components within normal limits  URINALYSIS, COMPLETE (UACMP) WITH MICROSCOPIC - Abnormal; Notable for the following components:   Color, Urine YELLOW (*)    APPearance HAZY (*)    Hgb urine dipstick MODERATE (*)    All other components within normal limits  HCG, QUANTITATIVE, PREGNANCY - Abnormal; Notable for the following components:   hCG, Beta Chain, Quant, S 12,354 (*)    All other components within normal limits  POCT PREGNANCY, URINE - Abnormal; Notable for the following components:   Preg Test, Ur POSITIVE (*)    All other components within normal limits  LIPASE, BLOOD  POC URINE PREG, ED    RADIOLOGY I, [place] Maygan Koeller, personally viewed and evaluated these images (plain radiographs) as part of my medical decision making, as well as reviewing the written report by the radiologist.  ED MD interpretation: Findings concerning for right adnexal/ovarian ectopic pregnancy with possible rupture  Official radiology report(s): US Ob Comp Less 14 Wks  Result Date: 01/01/2019 CLINICAL DATA:  Pelvic pain and vaginal bleeding EXAM: OBSTETRIC <14 WK [place] AND TRANSVAGINAL OB US TECHNIQUE: Both transabdominal and transvaginal ultrasound examinations were performed for complete evaluation of the gestation as well as the maternal uterus, adnexal regions, and pelvic cul-de-sac. Transvaginal  technique was performed to assess early pregnancy. COMPARISON:  None. FINDINGS: Intrauterine gestational sac: None Yolk sac:  Not Visualized. Embryo:  Visualized. Cardiac Activity: Not Visualized. Heart Rate: CRL:  15.5 mm   7 w   6 d Subchorionic hemorrhage:  None visualized. Maternal uterus/adnexae: Within the right adnexa separate from the uterus there is a complex large mass with a gestational sac and embryo seen. The mass appears to be either within or adjacent to the right ovary which is not clearly visualized separate. There is a small amount of free fluid seen adjacent to the complex mass. No intrauterine pregnancy is seen. There is a small hypoechoic fibroid in lower uterine segment measuring 1.6 x 1.5 x 1.5 cm. The left ovary is not visualized. IMPRESSION: Findings concerning for right adnexal/ovarian ectopic with possible rupture. No intrauterine pregnancy seen. These results were called by telephone at the time of interpretation on 01/01/2019 at 10:06 pm to Dr. Marjean Donna , who verbally acknowledged these results. Electronically Signed   By: Prudencio Pair M.D.   On: 01/01/2019 22:06   US Ob Transvaginal  Result Date: 01/01/2019 CLINICAL  DATA:  Pelvic pain and vaginal bleeding EXAM: OBSTETRIC <14 WK US AND TRANSVAGINAL OB US TECHNIQUE: Both transabdominal and transvaginal ultrasound examinations were performed for complete evaluation of the gestation as well as the maternal uterus, adnexal regions, and pelvic cul-de-sac. Transvaginal technique was performed to assess early pregnancy. COMPARISON:  None. FINDINGS: Intrauterine gestational sac: None Yolk sac:  Not Visualized. Embryo:  Visualized. Cardiac Activity: Not Visualized. Heart Rate: CRL:  15.5 mm   7 w   6 d Subchorionic hemorrhage:  None visualized. Maternal uterus/adnexae: Within the right adnexa separate from the uterus there is a complex large mass with a gestational sac and embryo seen. The mass appears to be either within or adjacent to  the right ovary which is not clearly visualized separate. There is a small amount of free fluid seen adjacent to the complex mass. No intrauterine pregnancy is seen. There is a small hypoechoic fibroid in lower uterine segment measuring 1.6 x 1.5 x 1.5 cm. The left ovary is not visualized. IMPRESSION: Findings concerning for right adnexal/ovarian ectopic with possible rupture. No intrauterine pregnancy seen. These results were called by telephone at the time of interpretation on 01/01/2019 at 10:06 pm to Dr. Bayard MalesANDOLPH Alley Neils , who verbally acknowledged these results. Electronically Signed   By: Jonna ClarkBindu  Avutu M.D.   On: 01/01/2019 22:06    ____________________________________________   PROCEDURES    .Critical Care Performed by: Darci CurrentBrown, [place] N, MD Authorized by: Darci CurrentBrown, [place] N, MD   Critical care provider statement:    Critical care time (minutes):  30   Critical care time was exclusive of:  Separately billable procedures and treating other patients (Ruptured ectopic pregnancy)   Critical care was time spent personally by me on the following activities:  Development of treatment plan with patient or surrogate, discussions with consultants, evaluation of patient's response to treatment, examination of patient, obtaining history from patient or surrogate, ordering and performing treatments and interventions, ordering and review of laboratory studies, ordering and review of radiographic studies, pulse oximetry, re-evaluation of patient's condition and review of old charts     ____________________________________________   INITIAL IMPRESSION / MDM / ASSESSMENT AND PLAN / ED COURSE  As part of my medical decision making, I reviewed the following data within the electronic MEDICAL RECORD NUMBER  33 year old female presented with above-stated history and physical exam concerning for ectopic pregnancy, miscarriage ovarian cyst subchorionic hemorrhage etc.  Patient's hemoglobin quantitative 12,354 and  54.  Ultrasound OB revealed a right ectopic pregnancy with signs of rupture.  Patient discussed with Dr. Logan BoresEvans OB/GYN plan to evaluate the patient in the emergency department and taken to the operating room.  Patient's pain improved after receiving IV morphine 2 mg.     ____________________________________________  FINAL CLINICAL IMPRESSION(S) / ED DIAGNOSES  Final diagnoses:  Ruptured right tubal ectopic pregnancy causing hemoperitoneum     MEDICATIONS GIVEN DURING THIS VISIT:  Medications  ondansetron (ZOFRAN) injection 4 mg (4 mg Intravenous Given 01/01/19 1955)  morphine 2 MG/ML injection 2 mg (2 mg Intravenous Given 01/01/19 1956)  sodium chloride 0.9 % bolus 1,000 mL (0 mLs Intravenous Stopped 01/01/19 2213)     ED Discharge Orders    None      *Please note:  Michelle Fitzgerald was evaluated in Emergency Department on 01/01/2019 for the symptoms described in the history of present illness. She was evaluated in the context of the global COVID-19 pandemic, which necessitated consideration that the patient might be at risk for infection  with the SARS-CoV-2 virus that causes COVID-19. Institutional protocols and algorithms that pertain to the evaluation of patients at risk for COVID-19 are in a state of rapid change based on information released by regulatory bodies including the CDC and federal and state organizations. These policies and algorithms were followed during the patient's care in the ED.  Some ED evaluations and interventions may be delayed as a result of limited staffing during the pandemic.*  Note:  This document was prepared using Dragon voice recognition software and may include unintentional dictation errors.   Darci CurrentBrown, [place] N, MD 01/01/19 2221

## 2019-01-01 NOTE — ED Notes (Signed)
ED TO INPATIENT HANDOFF REPORT  ED Nurse Name and Phone #: Clinton Sawyerkailey [phone]  S Name/Age/Gender Michelle Fitzgerald 33 y.o. female Room/Bed: ED35A/ED35A  Code Status   Code Status: Not on file  Home/SNF/Other Home Patient oriented to: self, place, time and situation Is this baseline? Yes   Triage Complete: Triage complete  Chief Complaint Lower Abd Pain  Triage Note Patient reports lower abdominal and pelvic pain that started this morning. Patient reports N/V for the last few weeks. Patient states reports she had a miscarriage last week at approx 6 weeks. Denies fever at home.     Allergies No Known Allergies  Level of Care/Admitting Diagnosis ED Disposition    ED Disposition Condition Comment   Admit        B Medical/Surgery History History reviewed. No pertinent past medical history. Past Surgical History:  Procedure Laterality Date  . FINGER TENDON REPAIR Right    right middle finger  . LUMBAR LAMINECTOMY/DECOMPRESSION MICRODISCECTOMY N/A 06/14/2017   Procedure: LUMBAR LAMINECTOMY/DECOMPRESSION MICRODISCECTOMY 2 LEVELS-L4-5,L5-S1;  Surgeon: Venetia NightYarbrough, [place], MD;  Location: [facility] ORS;  Service: Neurosurgery;  Laterality: N/A;  . TONSILLECTOMY       A IV Location/Drains/Wounds Patient Lines/Drains/Airways Status   Active Line/Drains/Airways    Name:   Placement date:   Placement time:   Site:   Days:   Peripheral IV 01/01/19 Left Antecubital   01/01/19    1929    Antecubital   less than 1   Incision (Closed) 06/14/17 Back   06/14/17    0812     566          Intake/Output Last 24 hours No intake or output data in the 24 hours ending 01/01/19 2213  Labs/Imaging Results for orders placed or performed during the hospital encounter of 01/01/19 (from the past 48 hour(s))  Lipase, blood     Status: None   Collection Time: 01/01/19  4:49 PM  Result Value Ref Range   Lipase 17 11 - 51 U/L    Comment: Performed at [facility], [address]  [place], [place] [address]  Comprehensive metabolic panel     Status: Abnormal   Collection Time: 01/01/19  4:49 PM  Result Value Ref Range   Sodium 137 135 - 145 mmol/L   Potassium 4.0 3.5 - 5.1 mmol/L   Chloride 105 98 - 111 mmol/L   CO2 23 22 - 32 mmol/L   Glucose, Bld 103 (H) 70 - 99 mg/dL   BUN 12 6 - 20 mg/dL   Creatinine, Ser 4.090.79 0.44 - 1.00 mg/dL   Calcium 9.2 8.9 - 81.110.3 mg/dL   Total Protein 6.6 6.5 - 8.1 g/dL   Albumin 3.8 3.5 - 5.0 g/dL   AST 24 15 - 41 U/L   ALT 38 0 - 44 U/L   Alkaline Phosphatase 52 38 - 126 U/L   Total Bilirubin 0.6 0.3 - 1.2 mg/dL   GFR calc non Af Amer >60 >60 mL/min   GFR calc Af Amer >60 >60 mL/min   Anion gap 9 5 - 15    Comment: Performed at [facility], [address] [place], [place] [address]  CBC     Status: Abnormal   Collection Time: 01/01/19  4:49 PM  Result Value Ref Range   WBC 15.6 (H) 4.0 - 10.5 K/uL   RBC 4.28 3.87 - 5.11 MIL/uL   Hemoglobin 12.5 12.0 - 15.0 g/dL   HCT 29.537.9 62.136.0 - 30.846.0 %  MCV 88.6 80.0 - 100.0 fL   MCH 29.2 26.0 - 34.0 pg   MCHC 33.0 30.0 - 36.0 g/dL   RDW 12.6 11.5 - 15.5 %   Platelets 380 150 - 400 K/uL   nRBC 0.0 0.0 - 0.2 %    Comment: Performed at [facility], [address] [place], [place] [address]  Urinalysis, Complete w Microscopic     Status: Abnormal   Collection Time: 01/01/19  4:49 PM  Result Value Ref Range   Color, Urine YELLOW (A) YELLOW   APPearance HAZY (A) CLEAR   Specific Gravity, Urine 1.021 1.005 - 1.030   pH 5.0 5.0 - 8.0   Glucose, UA NEGATIVE NEGATIVE mg/dL   Hgb urine dipstick MODERATE (A) NEGATIVE   Bilirubin Urine NEGATIVE NEGATIVE   Ketones, ur NEGATIVE NEGATIVE mg/dL   Protein, ur NEGATIVE NEGATIVE mg/dL   Nitrite NEGATIVE NEGATIVE   Leukocytes,Ua NEGATIVE NEGATIVE   RBC / HPF 0-5 0 - 5 RBC/hpf   WBC, UA 6-10 0 - 5 WBC/hpf   Bacteria, UA NONE SEEN NONE SEEN   Squamous Epithelial / LPF 0-5 0 - 5   Mucus PRESENT     Comment: Performed at [facility], [address] [place], [place] [address]  hCG, quantitative, pregnancy     Status: Abnormal   Collection Time: 01/01/19  4:49 PM  Result Value Ref Range   hCG, Beta Chain, Quant, S 12,354 (H) <5 mIU/mL    Comment:          GEST. AGE      CONC.  (mIU/mL)   <=1 WEEK        5 - 50     2 WEEKS       50 - 500     3 WEEKS       100 - 10,000     4 WEEKS     1,000 - 30,000     5 WEEKS     3,500 - 115,000   6-8 WEEKS     12,000 - 270,000    12 WEEKS     15,000 - 220,000        FEMALE AND NON-PREGNANT FEMALE:     LESS THAN 5 mIU/mL Performed at [facility], [address] [place], [place] [address]   Pregnancy, urine POC     Status: Abnormal   Collection Time: 01/01/19  8:38 PM  Result Value Ref Range   Preg Test, Ur POSITIVE (A) NEGATIVE    Comment:        THE SENSITIVITY OF THIS METHODOLOGY IS >24 mIU/mL    US Ob Comp Less 14 Wks  Result Date: 01/01/2019 CLINICAL DATA:  Pelvic pain and vaginal bleeding EXAM: OBSTETRIC <14 WK [place] AND TRANSVAGINAL OB US TECHNIQUE: Both transabdominal and transvaginal ultrasound examinations were performed for complete evaluation of the gestation as well as the maternal uterus, adnexal regions, and pelvic cul-de-sac. Transvaginal technique was performed to assess early pregnancy. COMPARISON:  None. FINDINGS: Intrauterine gestational sac: None Yolk sac:  Not Visualized. Embryo:  Visualized. Cardiac Activity: Not Visualized. Heart Rate: CRL:  15.5 mm   7 w   6 d Subchorionic hemorrhage:  None visualized. Maternal uterus/adnexae: Within the right adnexa separate from the uterus there is a complex large mass with a gestational sac and embryo seen. The mass appears to be either within or adjacent to the right ovary which is not clearly visualized separate. There is a small  amount of free fluid seen adjacent to the complex mass. No intrauterine pregnancy is seen. There is a small hypoechoic fibroid in lower uterine segment measuring 1.6 x 1.5 x  1.5 cm. The left ovary is not visualized. IMPRESSION: Findings concerning for right adnexal/ovarian ectopic with possible rupture. No intrauterine pregnancy seen. These results were called by telephone at the time of interpretation on 01/01/2019 at 10:06 pm to Dr. Bayard MalesANDOLPH BROWN , who verbally acknowledged these results. Electronically Signed   By: Jonna ClarkBindu  Avutu M.D.   On: 01/01/2019 22:06   [place] Ob Transvaginal  Result Date: 01/01/2019 CLINICAL DATA:  Pelvic pain and vaginal bleeding EXAM: OBSTETRIC <14 WK US AND TRANSVAGINAL OB US TECHNIQUE: Both transabdominal and transvaginal ultrasound examinations were performed for complete evaluation of the gestation as well as the maternal uterus, adnexal regions, and pelvic cul-de-sac. Transvaginal technique was performed to assess early pregnancy. COMPARISON:  None. FINDINGS: Intrauterine gestational sac: None Yolk sac:  Not Visualized. Embryo:  Visualized. Cardiac Activity: Not Visualized. Heart Rate: CRL:  15.5 mm   7 w   6 d Subchorionic hemorrhage:  None visualized. Maternal uterus/adnexae: Within the right adnexa separate from the uterus there is a complex large mass with a gestational sac and embryo seen. The mass appears to be either within or adjacent to the right ovary which is not clearly visualized separate. There is a small amount of free fluid seen adjacent to the complex mass. No intrauterine pregnancy is seen. There is a small hypoechoic fibroid in lower uterine segment measuring 1.6 x 1.5 x 1.5 cm. The left ovary is not visualized. IMPRESSION: Findings concerning for right adnexal/ovarian ectopic with possible rupture. No intrauterine pregnancy seen. These results were called by telephone at the time of interpretation on 01/01/2019 at 10:06 pm to Dr. Bayard MalesANDOLPH BROWN , who verbally acknowledged these results. Electronically Signed   By: Jonna ClarkBindu  Avutu M.D.   On: 01/01/2019 22:06    Pending Labs Unresulted Labs (From admission, onward)   None       Vitals/Pain Today's Vitals   01/01/19 1647 01/01/19 1759 01/01/19 2028 01/01/19 2116  BP: 120/80 (!) 144/80  (!) 148/78  Pulse: 100 79  82  Resp: 16 18  20   Temp: 99.3 F (37.4 C) 98.9 F (37.2 C)    TempSrc: Oral Oral    SpO2: 99% 97%  98%  Weight: 113.4 kg     Height: 5\' 3"  (1.6 m)     PainSc: 4  4  3       Isolation Precautions No active isolations  Medications Medications  ondansetron (ZOFRAN) injection 4 mg (4 mg Intravenous Given 01/01/19 1955)  morphine 2 MG/ML injection 2 mg (2 mg Intravenous Given 01/01/19 1956)  sodium chloride 0.9 % bolus 1,000 mL (0 mLs Intravenous Stopped 01/01/19 2213)    Mobility walks Low fall risk   Focused Assessments surgical    R Recommendations: See Admitting Provider Note  Report given to:   Additional Notes: Last time pt ate at 1330

## 2019-01-01 NOTE — Interval H&P Note (Signed)
History and Physical Interval Note:  01/01/2019 11:24 PM  Michelle Fitzgerald  has presented today for surgery, with the diagnosis of n/a.  The various methods of treatment have been discussed with the patient and family. After consideration of risks, benefits and other options for treatment, the patient has consented to  Procedure(s): DIAGNOSTIC LAPAROSCOPY WITH REMOVAL OF ECTOPIC PREGNANCY (N/A) as a surgical intervention.  The patient's history has been reviewed, patient examined, no change in status, stable for surgery.  I have reviewed the patient's chart and labs.  Questions were answered to the patient's satisfaction.     David Evans   

## 2019-01-01 NOTE — H&P (Signed)
@LOGO @      PRE-OPERATIVE HISTORY AND PHYSICAL EXAM  PCP:  Patient, No Pcp Per Subjective:   HPI:  Michelle Fitzgerald is a 33 y.o. G0P0000.  No LMP recorded.  She presents today for a pre-op discussion and PE.   She has the following symptoms:  Severe abdominal pain -suspected ruptured ectopic pregnancy Patient denies history of abdominal surgery.  Denies history of STDs.  Review of Systems:   Constitutional: Denied constitutional symptoms, night sweats, recent illness, fatigue, fever, insomnia and weight loss.  Eyes: Denied eye symptoms, eye pain, photophobia, vision change and visual disturbance.  Ears/Nose/Throat/Neck: Denied ear, nose, throat or neck symptoms, hearing loss, nasal discharge, sinus congestion and sore throat.  Cardiovascular: Denied cardiovascular symptoms, arrhythmia, chest pain/pressure, edema, exercise intolerance, orthopnea and palpitations.  Respiratory: Denied pulmonary symptoms, asthma, pleuritic pain, productive sputum, cough, dyspnea and wheezing.  Gastrointestinal: Denied, gastro-esophageal reflux, melena, nausea and vomiting.  Genitourinary:  Vaginal bleeding  Musculoskeletal: Denied musculoskeletal symptoms, stiffness, swelling, muscle weakness and myalgia.  Dermatologic: Denied dermatology symptoms, rash and scar.  Neurologic: Denied neurology symptoms, dizziness, headache, neck pain and syncope.  Psychiatric: Denied psychiatric symptoms, anxiety and depression.  Endocrine: Denied endocrine symptoms including hot flashes and night sweats.   OB History  Gravida Para Term Preterm AB Living  0 0 0 0 0 0  SAB TAB Ectopic Multiple Live Births  0 0 0 0 0    History reviewed. No pertinent past medical history.  Past Surgical History:  Procedure Laterality Date  . FINGER TENDON REPAIR Right    right middle finger  . LUMBAR LAMINECTOMY/DECOMPRESSION MICRODISCECTOMY N/A 06/14/2017   Procedure: LUMBAR LAMINECTOMY/DECOMPRESSION MICRODISCECTOMY 2  LEVELS-L4-5,L5-S1;  Surgeon: Meade Maw, MD;  Location: [facility] ORS;  Service: Neurosurgery;  Laterality: N/A;  . TONSILLECTOMY        SOCIAL HISTORY: Social History   Tobacco Use  Smoking Status Current Some Day Smoker  . Packs/day: 1.00  . Types: Cigarettes  Smokeless Tobacco Never Used   Social History   Substance and Sexual Activity  Alcohol Use No   Social History   Substance and Sexual Activity  Drug Use No    Family History  Problem Relation Age of Onset  . Diabetes Father   . Diabetes Maternal Grandmother   . Cancer Maternal Grandmother   . Hypertension Maternal Grandmother     ALLERGIES:  Patient has no known allergies.  MEDS:   No current facility-administered medications on file prior to encounter.    Current Outpatient Medications on File Prior to Encounter  Medication Sig Dispense Refill  . celecoxib (CELEBREX) 200 MG capsule Take 200 mg by mouth 2 (two) times daily.    . methocarbamol (ROBAXIN) 500 MG tablet Take 1 tablet (500 mg total) by mouth 3 (three) times daily. 90 tablet 0  . oxyCODONE (ROXICODONE) 5 MG immediate release tablet Take 1 tablet (5 mg total) by mouth every 4 (four) hours as needed for breakthrough pain. 30 tablet 0    Meds ordered this encounter  Medications  . ondansetron (ZOFRAN) injection 4 mg  . morphine 2 MG/ML injection 2 mg  . sodium chloride 0.9 % bolus 1,000 mL     Physical examination BP (!) 135/91   Pulse 77   Temp 98.9 F (37.2 C) (Oral)   Resp 16   Ht 5\' 3"  (1.6 m)   Wt 113.4 kg   SpO2 98%   BMI 44.29 kg/m   General NAD, Conversant  HEENT Atraumatic;  Op clear with mmm.  Normo-cephalic. Pupils reactive. Anicteric sclerae  Thyroid/Neck Smooth without nodularity or enlargement. Normal ROM.  Neck Supple.  Skin No rashes, lesions or ulceration. Normal palpated skin turgor. No nodularity.  Breasts: No masses or discharge.  Symmetric.  No axillary adenopathy.  Lungs: Clear to auscultation.No rales or  wheezes. Normal Respiratory effort, no retractions.  Heart: NSR.  No murmurs or rubs appreciated. No periferal edema  Abdomen:  Tender to palpation greater in right lower quadrant guarding moderate rebound  Extremities: Moves all appropriately.  Normal ROM for age. No lymphadenopathy.  Neuro: Oriented to PPT.  Normal mood. Normal affect.   Ultrasound reveals right side sac with fetal pole-appears ruptured   Assessment:   G0P0000 There are no active problems to display for this patient.   1. Ruptured right tubal ectopic pregnancy causing hemoperitoneum      Plan:   Orders: Meds ordered this encounter  Medications  . ondansetron (ZOFRAN) injection 4 mg  . morphine 2 MG/ML injection 2 mg  . sodium chloride 0.9 % bolus 1,000 mL     1.  Exploratory laparoscopy probable right salpingectomy    Risks of surgery including anesthesia bleeding infection damage to internal organs discussed in detail.  Future fertility discussed in detail.  All questions answered.    Elonda Huskyavid J. Lorely Bubb, M.D. 01/01/2019 11:18 PM

## 2019-01-01 NOTE — ED Triage Notes (Signed)
Patient reports lower abdominal and pelvic pain that started this morning. Patient reports N/V for the last few weeks. Patient states reports she had a miscarriage last week at approx 6 weeks. Denies fever at home.

## 2019-01-01 NOTE — ED Notes (Signed)
Patient ambulated to restroom independently, NAD, complains of some abdominal pain

## 2019-01-01 NOTE — Interval H&P Note (Signed)
History and Physical Interval Note:  01/01/2019 11:24 PM  Michelle Fitzgerald  has presented today for surgery, with the diagnosis of n/a.  The various methods of treatment have been discussed with the patient and family. After consideration of risks, benefits and other options for treatment, the patient has consented to  Procedure(s): DIAGNOSTIC LAPAROSCOPY WITH REMOVAL OF ECTOPIC PREGNANCY (N/A) as a surgical intervention.  The patient's history has been reviewed, patient examined, no change in status, stable for surgery.  I have reviewed the patient's chart and labs.  Questions were answered to the patient's satisfaction.     Jeannie Fend

## 2019-01-01 NOTE — ED Notes (Signed)
VS obtained by this RN and Tracey, EDT. Apologized for delay and updated patient 

## 2019-01-01 NOTE — ED Notes (Signed)
Patient transported to Ultrasound 

## 2019-01-02 ENCOUNTER — Encounter: Payer: Self-pay | Admitting: Obstetrics and Gynecology

## 2019-01-02 DIAGNOSIS — K661 Hemoperitoneum: Secondary | ICD-10-CM | POA: Diagnosis not present

## 2019-01-02 DIAGNOSIS — O00101 Right tubal pregnancy without intrauterine pregnancy: Secondary | ICD-10-CM | POA: Diagnosis not present

## 2019-01-02 DIAGNOSIS — Z515 Encounter for palliative care: Secondary | ICD-10-CM

## 2019-01-02 DIAGNOSIS — N736 Female pelvic peritoneal adhesions (postinfective): Secondary | ICD-10-CM | POA: Diagnosis not present

## 2019-01-02 MED ORDER — OXYCODONE-ACETAMINOPHEN 5-325 MG PO TABS: 1.0000 | ORAL_TABLET | Freq: Four times a day (QID) | ORAL | 0 refills | Status: DC | PRN

## 2019-01-02 MED ORDER — ACETAMINOPHEN 10 MG/ML IV SOLN
INTRAVENOUS | Status: AC
  Filled 2019-01-02: qty 100

## 2019-01-02 MED ORDER — FENTANYL CITRATE (PF) 100 MCG/2ML IJ SOLN
INTRAMUSCULAR | Status: AC
  Filled 2019-01-02: qty 2

## 2019-01-02 MED ORDER — KETOROLAC TROMETHAMINE 30 MG/ML IJ SOLN: 30.0000 mg | Freq: Once | INTRAMUSCULAR | Status: DC

## 2019-01-02 MED ORDER — PROMETHAZINE HCL 25 MG/ML IJ SOLN: 6.2500 mg | INTRAMUSCULAR | Status: DC | PRN

## 2019-01-02 MED ORDER — FENTANYL CITRATE (PF) 100 MCG/2ML IJ SOLN
25.0000 ug | INTRAMUSCULAR | Status: DC | PRN
  Administered 2019-01-02 (×2): 25 ug via INTRAVENOUS
  Administered 2019-01-02: 50 ug via INTRAVENOUS

## 2019-01-02 MED ORDER — DEXTROSE IN LACTATED RINGERS 5 % IV SOLN: INTRAVENOUS | Status: DC

## 2019-01-02 MED ORDER — LACTATED RINGERS IV SOLN: INTRAVENOUS | Status: DC

## 2019-01-02 MED ORDER — SUGAMMADEX SODIUM 200 MG/2ML IV SOLN
INTRAVENOUS | Status: AC
  Filled 2019-01-02: qty 2

## 2019-01-02 MED ORDER — OXYCODONE-ACETAMINOPHEN 5-325 MG PO TABS
ORAL_TABLET | ORAL | Status: AC
  Administered 2019-01-02: 1 via ORAL
  Filled 2019-01-02: qty 1

## 2019-01-02 MED ORDER — DEXMEDETOMIDINE HCL 200 MCG/2ML IV SOLN
INTRAVENOUS | Status: DC | PRN
  Administered 2019-01-02: 8 ug via INTRAVENOUS

## 2019-01-02 MED ORDER — KETOROLAC TROMETHAMINE 30 MG/ML IJ SOLN
INTRAMUSCULAR | Status: DC | PRN
  Administered 2019-01-02: 30 mg via INTRAVENOUS

## 2019-01-02 MED ORDER — LACTATED RINGERS IV SOLN
INTRAVENOUS | Status: DC | PRN
  Administered 2019-01-01 – 2019-01-02 (×2): via INTRAVENOUS

## 2019-01-02 MED ORDER — ONDANSETRON HCL 4 MG/2ML IJ SOLN: 4.0000 mg | Freq: Four times a day (QID) | INTRAMUSCULAR | Status: DC | PRN

## 2019-01-02 MED ORDER — OXYCODONE-ACETAMINOPHEN 5-325 MG PO TABS: 1.0000 | ORAL_TABLET | ORAL | Status: DC | PRN

## 2019-01-02 MED ORDER — OXYCODONE-ACETAMINOPHEN 5-325 MG PO TABS
1.0000 | ORAL_TABLET | Freq: Four times a day (QID) | ORAL | Status: DC | PRN
  Administered 2019-01-02: 1 via ORAL

## 2019-01-02 MED ORDER — ONDANSETRON 4 MG PO TBDP: 4.0000 mg | ORAL_TABLET | Freq: Four times a day (QID) | ORAL | Status: DC | PRN

## 2019-01-02 MED ORDER — SUGAMMADEX SODIUM 200 MG/2ML IV SOLN
INTRAVENOUS | Status: DC | PRN
  Administered 2019-01-02: 220 mg via INTRAVENOUS

## 2019-01-02 MED ORDER — ACETAMINOPHEN 10 MG/ML IV SOLN
INTRAVENOUS | Status: DC | PRN
  Administered 2019-01-02: 1000 mg via INTRAVENOUS

## 2019-01-02 NOTE — Anesthesia Postprocedure Evaluation (Signed)
Anesthesia Post Note  Patient: Michelle Fitzgerald  Procedure(s) Performed: DIAGNOSTIC LAPAROSCOPY WITH REMOVAL OF ECTOPIC PREGNANCY (N/A )  Patient location during evaluation: PACU Anesthesia Type: General Level of consciousness: awake and alert Pain management: pain level controlled Vital Signs Assessment: post-procedure vital signs reviewed and stable Respiratory status: spontaneous breathing, nonlabored ventilation, respiratory function stable and patient connected to nasal cannula oxygen Cardiovascular status: blood pressure returned to baseline and stable Postop Assessment: no apparent nausea or vomiting Anesthetic complications: no     Last Vitals:  Vitals:   01/02/19 0335 01/02/19 0340  BP: 130/83   Pulse: 68 66  Resp: 10 14  Temp: (!) 36.3 C   SpO2: 97% 97%    Last Pain:  Vitals:   01/02/19 0340  TempSrc:   PainSc: 2                  Martha Clan

## 2019-01-02 NOTE — Anesthesia Post-op Follow-up Note (Signed)
Anesthesia QCDR form completed.        

## 2019-01-02 NOTE — Op Note (Signed)
 @LOGO @    OPERATIVE NOTE 01/02/2019 2:03 AM  PRE-OPERATIVE DIAGNOSIS:  1) suspected hemoperitoneum, suspected ectopic pregnancy.  POST-OPERATIVE DIAGNOSIS:  Confirmed hemoperitoneum and right tubal ectopic pregnancy Extensive pelvic adhesive disease  OPERATION:  DIAGNOSTIC LAPAROSCOPY WITH REMOVAL OF ECTOPIC PREGNANCY:  LYSIS OF ADHESIONS SURGEON(S): Surgeon(s) and Role:    Linzie Collin* ,  James, MD - Primary   ANESTHESIA: Choice  ESTIMATED BLOOD LOSS: 350 mL  OPERATIVE FINDINGS: Large right tubal ectopic pregnancy scarred into the cul-de-sac.  Edema to the entire right adnexa.  Left-sided bowel adhesions and bowel adhesions to the left fallopian tube.  Hemoperitoneum with large clots.  SPECIMEN:  ID Type Source Tests Collected by Time Destination  1 : Right Tube Ectopic  [facility]issue [facility] Ectopic SURGICAL PATHOLOGY Linzie Collin,  James, MD 01/02/2019 [phone]     COMPLICATIONS: None  DISPOSITION: Stable to recovery room  DESCRIPTION OF PROCEDURE:      The patient was prepped and draped in the dorsolithotomy position and placed under general anesthesia. The bladder was emptied. The cervix was grasped with a multi-toothed tenaculum and a uterine manipulator was placed within the cervical os respecting the position and curvature of the uterus. After changing gloves we proceeded abdominally. A small infraumbilical incision was made and a 5 mm trocar port was placed within the abdominopelvic cavity. The opening pressure was less than 7 mmHg.  Approximately 3 and 1/2 L of carbon dioxide gas was instilled within the abdominal pelvic cavity. The laparoscope was placed and the pelvis and abdomen were carefully inspected. Right and left lower quadrant ports were placed under direct visualization in the usual manner.  We immediately noted hemoperitoneum suction was placed and blood and clot was suctioned from the pelvis.  We found bowel adhesions to the left fallopian tube left sidewall and the uterus and  right fallopian tube were encased in adhesions and stuck in the cul-de-sac.  Using blunt and sharp dissection we systematically cut the bowel free from the left fallopian tube and sidewall.  We were then able to partially visualize the dilated swollen tube on the right which was located in the cul-de-sac.  Additional lysis of adhesions was performed and the tube was finally freed from its attachment in the cul-de-sac.  This allowed us to identify the ectopic pregnancy and the massively dilated tube.  The tube was coagulated and divided and a section of the broad ligament was also coagulated and divided freeing the entire ectopic pregnancy from the broad ligament and tube.  We found the fimbriated end adherent to the right ovary and this was also coagulated and divided to free it from this adhesion.  Hemostasis was noted from all cut surfaces. An Endo Catch bag was then placed in the left lower quadrant and the pregnancy was placed within the bag.  We were unable to deliver the bag through the left lower quadrant port because of the large size of the ectopic.  The incision was slightly extended in this area and sections of the ectopic and fallopian tube were removed in piecemeal from the Endo Catch bag until the bag was small enough to work it through the abdominal incision and deliver it intact. We then reexplored the pelvis irrigation was used. Hemostasis of all areas of the pelvis was noted. The ports were removed under direct visualization.  Hemostasis was noted.  The laparoscope was removed the trocar sleeve was removed and the incision was closed with a deep suture through the fascia of 0 Vicryl followed by  subcuticular closure of the skin for all port sites. Steri-Strips were applied. The uterine manipulator was removed. Hemostasis of the cervix was noted. The patient went to the recovery room in stable condition.  Finis Bud, M.D. 01/02/2019 2:03 AM

## 2019-01-02 NOTE — Anesthesia Procedure Notes (Signed)
Procedure Name: Intubation Date/Time: 01/01/2019 11:37 PM Performed by: Dionne Bucy, CRNA Pre-anesthesia Checklist: Patient identified, Patient being monitored, Timeout performed, Emergency Drugs available and Suction available Patient Re-evaluated:Patient Re-evaluated prior to induction Oxygen Delivery Method: Circle system utilized Preoxygenation: Pre-oxygenation with 100% oxygen Induction Type: IV induction, Rapid sequence and Cricoid Pressure applied Ventilation: Mask ventilation without difficulty Laryngoscope Size: 3 and McGraph Grade View: Grade I Tube type: Oral Tube size: 7.0 mm Number of attempts: 1 Airway Equipment and Method: Stylet and Video-laryngoscopy Placement Confirmation: ETT inserted through vocal cords under direct vision,  positive ETCO2 and breath sounds checked- equal and bilateral Secured at: 21 cm Tube secured with: Tape Dental Injury: Teeth and Oropharynx as per pre-operative assessment

## 2019-01-02 NOTE — Discharge Instructions (Addendum)
Ectopic Pregnancy  An ectopic pregnancy happens when a fertilized egg grows outside the womb (uterus). The fertilized egg cannot stay alive outside of the womb. This problem often happens in a fallopian tube. It is often caused by damage to the tube. If this problem is found early, you may be treated with medicine that stops the egg from growing. If your tube tears or bursts open (ruptures), you will bleed inside. Often, there is very bad pain in the lower belly. This is an emergency. You will need surgery. Get help right away. Follow these instructions at home: After being treated with medicine or surgery:  Rest and limit your activity for as long as told by your doctor.  Until your doctor says that it is safe: ? Do not lift anything that is heavier than 10 lb (4.5 kg) or the limit that your doctor tells you. ? Avoid exercise and any movement that takes a lot of effort.  To prevent problems when pooping (constipation): ? Eat a healthy diet. This includes:  Fruits.  Vegetables.  Whole grains. ? Drink 6-8 glasses of water a day. Contact a doctor if: Get help right away if:  You have sudden and very bad pain in your belly.  You have very bad pain in your shoulders or neck.  You have pain that gets worse and is not helped by medicine.  You have: ? A fever or chills. ? Vaginal bleeding. ? Redness or swelling at the site of a surgical cut (incision).  You feel sick to your stomach (nauseous) or you throw up (vomit).  You feel dizzy or weak.  You feel light-headed or you pass out (faint). Summary  An ectopic pregnancy happens when a fertilized egg grows outside the womb (uterus).  If this problem is found early, you may be treated with medicine that stops the egg from growing.  If your tube tears or bursts open (ruptures), you will need surgery. This is an emergency. Get help right away. This information is not intended to replace advice given to you by your health care  provider. Make sure you discuss any questions you have with your health care provider. Document Released: 08/05/2008 Document Revised: 04/21/2017 Document Reviewed: 06/02/2016 Elsevier Patient Education  [place].  Diagnostic Laparoscopy, Care After This sheet gives you information about how to care for yourself after your procedure. Your health care provider may also give you more specific instructions. If you have problems or questions, contact your health care provider. What can I expect after the procedure? After the procedure, it is common to have:  Mild discomfort in the abdomen.  Sore throat. Women who have laparoscopy with pelvic examination may have mild cramping and fluid coming from the vagina for a few days after the procedure. Follow these instructions at home: Medicines  Take over-the-counter and prescription medicines only as told by your health care provider.  If you were prescribed an antibiotic medicine, take it as told by your health care provider. Do not stop taking the antibiotic even if you start to feel better. Driving  Do not drive for 24 hours if you were given a medicine to help you relax (sedative) during your procedure.  Do not drive or use heavy machinery while taking prescription pain medicine. Bathing  Do not take baths, swim, or use a hot tub until your health care provider approves. You may take showers. Incision care   Follow instructions from your health care provider about how to take  care of your incisions. Make sure you: ? Wash your hands with soap and water before you change your bandage (dressing). If soap and water are not available, use hand sanitizer. ? Change your dressing as told by your health care provider. ? Leave stitches (sutures), skin glue, or adhesive strips in place. These skin closures may need to stay in place for 2 weeks or longer. If adhesive strip edges start to loosen and curl up, you may trim the loose edges. Do  not remove adhesive strips completely unless your health care provider tells you to do that.  Check your incision areas every day for signs of infection. Check for: ? Redness, swelling, or pain. ? Fluid or blood. ? Warmth. ? Pus or a bad smell. Activity  Return to your normal activities as told by your health care provider. Ask your health care provider what activities are safe for you.  Do not lift anything that is heavier than 10 lb (4.5 kg), or the limit that you are told, until your health care provider says that it is safe. General instructions  To prevent or treat constipation while you are taking prescription pain medicine, your health care provider may recommend that you: ? Drink enough fluid to keep your urine pale yellow. ? Take over-the-counter or prescription medicines. ? Eat foods that are high in fiber, such as fresh fruits and vegetables, whole grains, and beans. ? Limit foods that are high in fat and processed sugars, such as fried and sweet foods.  Do not use any products that contain nicotine or tobacco, such as cigarettes and e-cigarettes. If you need help quitting, ask your health care provider.  Keep all follow-up visits as told by your health care provider. This is important. Contact a health care provider if:  You develop shoulder pain.  You feel lightheaded or faint.  You are unable to pass gas or have a bowel movement.  You feel nauseous or you vomit.  You develop a rash.  You have redness, swelling, or pain around any incision.  You have fluid or blood coming from any incision.  Any incision feels warm to the touch.  You have pus or a bad smell coming from any incision.  You have a fever or chills. Get help right away if:  You have severe pain.  You have vomiting that does not go away.  You have heavy bleeding from the vagina.  Any incision opens.  You have trouble breathing.  You have chest pain. Summary  After the procedure, it is  common to have mild discomfort in the abdomen and a sore throat.  Check your incision areas every day for signs of infection.  Return to your normal activities as told by your health care provider. Ask your health care provider what activities are safe for you. This information is not intended to replace advice given to you by your health care provider. Make sure you discuss any questions you have with your health care provider. Document Released: 04/20/2015 Document Revised: 04/21/2017 Document Reviewed: 11/02/2016 Elsevier Patient Education  2020 [employer] AMBULATORY SURGERY  DISCHARGE INSTRUCTIONS   1) The drugs that you were given will stay in your system until tomorrow so for the next 24 hours you should not:  A) Drive an automobile B) Make any legal decisions C) Drink any alcoholic beverage   2) You may resume regular meals tomorrow.  Today it is better to start with liquids and gradually work up to solid foods.  You may eat anything you prefer, but it is better to start with liquids, then soup and crackers, and gradually work up to solid foods.   3) Please notify your doctor immediately if you have any unusual bleeding, trouble breathing, redness and pain at the surgery site, drainage, fever, or pain not relieved by medication.    4) Additional Instructions:  Leave Steri-strips in place.  Will fall off on own.        Please contact your physician with any problems or Same Day Surgery at [phone], Monday through Friday 6 am to 4 pm, or [place] at [facility] Main number at [phone].

## 2019-01-02 NOTE — Transfer of Care (Signed)
Immediate Anesthesia Transfer of Care Note  Patient: Michelle Fitzgerald  Procedure(s) Performed: DIAGNOSTIC LAPAROSCOPY WITH REMOVAL OF ECTOPIC PREGNANCY (N/A )  Patient Location: PACU  Anesthesia Type:General  Level of Consciousness: drowsy and patient cooperative  Airway & Oxygen Therapy: Patient Spontanous Breathing  Post-op Assessment: Report given to RN and Post -op Vital signs reviewed and stable  Post vital signs: Reviewed and stable  Last Vitals:  Vitals Value Taken Time  BP 131/70 01/02/19 0219  Temp 98.55F   Pulse 76   Resp 17   SpO2 94%     Last Pain:  Vitals:   01/01/19 2028  TempSrc:   PainSc: 3          Complications: No apparent anesthesia complications

## 2019-01-03 LAB — SURGICAL PATHOLOGY

## 2019-01-04 ENCOUNTER — Telehealth: Payer: Self-pay | Admitting: Obstetrics and Gynecology

## 2019-01-04 NOTE — Telephone Encounter (Signed)
Patient called stating she is having some light bleeding when using the bathroom. She is also still having some abdominal pain. Please Advise.

## 2019-01-04 NOTE — Telephone Encounter (Signed)
Please advise. Is this normal?

## 2019-01-09 ENCOUNTER — Other Ambulatory Visit: Payer: Self-pay

## 2019-01-09 ENCOUNTER — Encounter: Payer: Self-pay | Admitting: Obstetrics and Gynecology

## 2019-01-09 ENCOUNTER — Ambulatory Visit (INDEPENDENT_AMBULATORY_CARE_PROVIDER_SITE_OTHER): Payer: Managed Care, Other (non HMO) | Admitting: Obstetrics and Gynecology

## 2019-01-09 VITALS — BP 125/77 | HR 87 | Ht 63.0 in | Wt 244.5 lb

## 2019-01-09 DIAGNOSIS — O00101 Right tubal pregnancy without intrauterine pregnancy: Secondary | ICD-10-CM

## 2019-01-09 DIAGNOSIS — Z9889 Other specified postprocedural states: Secondary | ICD-10-CM

## 2019-01-09 NOTE — Progress Notes (Signed)
HPI:      [name] is a 33 y.o. G4P0040 who LMP was No LMP recorded (lmp unknown).  Subjective:   She presents today approximately 1 week from laparoscopic salpingectomy for ectopic pregnancy.  She reports that her pain is significantly improved and she is no longer using oral pain medications.  She has no problems eating going to the bathroom or having bowel movements. She has been is considering birth control methods and she desires Nexplanon.    Hx: The following portions of the patient's history were reviewed and updated as appropriate:             She  has no past medical history on file. She does not have any pertinent problems on file. She  has a past surgical history that includes Tonsillectomy; Finger tendon repair (Right); Lumbar laminectomy/decompression microdiscectomy (N/A, 06/14/2017); and Diagnostic laparoscopy with removal of ectopic pregnancy (N/A, 01/01/2019). Her family history includes Cancer in her maternal grandmother; Diabetes in her father and maternal grandmother; Hypertension in her maternal grandmother. She  reports that she has been smoking cigarettes. She has been smoking about 1.00 pack per day. She has never used smokeless tobacco. She reports that she does not drink alcohol or use drugs. She currently has no medications in their medication list. She has No Known Allergies.       Review of Systems:  Review of Systems  Constitutional: Denied constitutional symptoms, night sweats, recent illness, fatigue, fever, insomnia and weight loss.  Eyes: Denied eye symptoms, eye pain, photophobia, vision change and visual disturbance.  Ears/Nose/Throat/Neck: Denied ear, nose, throat or neck symptoms, hearing loss, nasal discharge, sinus congestion and sore throat.  Cardiovascular: Denied cardiovascular symptoms, arrhythmia, chest pain/pressure, edema, exercise intolerance, orthopnea and palpitations.  Respiratory: Denied pulmonary symptoms, asthma, pleuritic  pain, productive sputum, cough, dyspnea and wheezing.  Gastrointestinal: Denied, gastro-esophageal reflux, melena, nausea and vomiting.  Genitourinary: Denied genitourinary symptoms including symptomatic vaginal discharge, pelvic relaxation issues, and urinary complaints.  Musculoskeletal: Denied musculoskeletal symptoms, stiffness, swelling, muscle weakness and myalgia.  Dermatologic: Denied dermatology symptoms, rash and scar.  Neurologic: Denied neurology symptoms, dizziness, headache, neck pain and syncope.  Psychiatric: Denied psychiatric symptoms, anxiety and depression.  Endocrine: Denied endocrine symptoms including hot flashes and night sweats.   Meds:   No current outpatient medications on file prior to visit.   No current facility-administered medications on file prior to visit.     Objective:     Vitals:   01/09/19 1405  BP: 125/77  Pulse: 87               Abdomen: Soft.  Non-tender.  No masses.  No HSM.  Incision/s: Intact.  Healing well.  No erythema.  No drainage.      Assessment:    G4P0040 Patient Active Problem List   Diagnosis Date Noted  . Hospice care patient 01/02/2019  . Palliative care patient 01/02/2019     1. Right tubal pregnancy without intrauterine pregnancy   2. Post-operative state     Patient doing very well from right ectopic pregnancy.   Plan:            1.  Recommend following beta hCGs to less than 25- beta-hCG ordered today.  2.  As patient is self-pay she desires a return to the health department for placement of Nexplanon. Orders No orders of the defined types were placed in this encounter.   No orders of the defined types were placed in this  encounter.     F/U  No follow-ups on file. I spent 23 minutes involved in the care of this patient of which greater than 50% was spent discussing discussing ectopic pregnancy, pathology findings, postop course, possible birth control methods, necessity of following beta hCGs.  All  questions answered.  Finis Bud, M.D. 01/09/2019 2:24 PM

## 2019-01-10 LAB — BETA HCG QUANT (REF LAB): hCG Quant: 146 m[IU]/mL

## 2019-01-11 ENCOUNTER — Telehealth: Payer: Self-pay

## 2019-01-11 NOTE — Telephone Encounter (Signed)
Patient came in for an appointment.

## 2019-01-11 NOTE — Telephone Encounter (Signed)
Pt wants to confirm she is able to go back to work on Friday 8/28.   She wants to make sure you are aware that she lifts 70+pounds at work.   Pt aware you will get this message Tuesday. Pls advise. ty

## 2019-01-16 ENCOUNTER — Other Ambulatory Visit: Payer: Self-pay

## 2019-01-16 ENCOUNTER — Telehealth: Payer: Self-pay | Admitting: Obstetrics and Gynecology

## 2019-01-16 DIAGNOSIS — O00101 Right tubal pregnancy without intrauterine pregnancy: Secondary | ICD-10-CM

## 2019-01-16 NOTE — Telephone Encounter (Signed)
The patient called and stated that her employer will not allow her to come back to work. The patient stated that she needs a new note that states she has **No restrictions** in order to return to work. Please advise.

## 2019-01-16 NOTE — Telephone Encounter (Signed)
Please advise. Ok to do note.

## 2019-01-16 NOTE — Telephone Encounter (Signed)
Letter printed and given to patient

## 2019-01-17 LAB — BETA HCG QUANT (REF LAB): hCG Quant: 21 m[IU]/mL

## 2019-01-21 ENCOUNTER — Other Ambulatory Visit: Payer: Self-pay

## 2019-01-21 ENCOUNTER — Ambulatory Visit (LOCAL_COMMUNITY_HEALTH_CENTER): Payer: Self-pay | Admitting: Physician Assistant

## 2019-01-21 ENCOUNTER — Encounter: Payer: Self-pay | Admitting: Physician Assistant

## 2019-01-21 VITALS — BP 114/74 | Ht 63.5 in | Wt 246.8 lb

## 2019-01-21 DIAGNOSIS — B9689 Other specified bacterial agents as the cause of diseases classified elsewhere: Secondary | ICD-10-CM

## 2019-01-21 DIAGNOSIS — Z30017 Encounter for initial prescription of implantable subdermal contraceptive: Secondary | ICD-10-CM

## 2019-01-21 DIAGNOSIS — Z3009 Encounter for other general counseling and advice on contraception: Secondary | ICD-10-CM

## 2019-01-21 DIAGNOSIS — Z113 Encounter for screening for infections with a predominantly sexual mode of transmission: Secondary | ICD-10-CM

## 2019-01-21 LAB — WET PREP FOR TRICH, YEAST, CLUE
Trichomonas Exam: NEGATIVE
Yeast Exam: NEGATIVE

## 2019-01-21 MED ORDER — ETONOGESTREL 68 MG ~~LOC~~ IMPL
68.0000 mg | DRUG_IMPLANT | Freq: Once | SUBCUTANEOUS | Status: AC
  Administered 2019-01-21: 11:00:00 68 mg via SUBCUTANEOUS

## 2019-01-21 MED ORDER — METRONIDAZOLE 500 MG PO TABS: 500.0000 mg | ORAL_TABLET | Freq: Two times a day (BID) | ORAL | 0 refills | Status: DC

## 2019-01-21 NOTE — Progress Notes (Signed)
Family Planning Visit- Repeat Yearly Visit  Subjective:  Michelle Fitzgerald is a 33 y.o. being seen today for an well woman visit and to discuss family planning options.    She is currently using abstinence for pregnancy prevention. Patient reports she does not want a pregnancy in the next year. Patient  has Hospice care patient and Palliative care patient on their problem list.  Chief Complaint  Patient presents with  . Contraception    desires nexplanon insertion  . SEXUALLY TRANSMITTED DISEASE    Patient reports that she has used Depo, OCs, and the patch in the past for [facility].  Smokes 1ppd and is not ready to quit.  Declines any counseling and info on same today.  Had R salpingectomy about 2 weeks ago and is feeling well.  Had post op check with provider.  Would like pelvic to check for BV and to have Nexplanon placement today.  Declines bloodwork. Reviewed family hx and surgical hx with patient that is in chart and per patient, it is correct.  Patient denies any questions on concerns except as noted above.    Does the patient desire a pregnancy in the next year? (OKQ flowsheet)  See flowsheet for other program required questions.   Body mass index is 43.03 kg/m. - Patient is eligible for diabetes screening based on BMI and age 31>40?  not applicable HA1C ordered? not applicable  Patient reports 1 of partners in last year. Desires STI screening?  Yes  Does the patient have a current or past history of drug use? No   No components found for: HCV]   Health Maintenance Due  Topic Date Due  . TETANUS/TDAP  03/11/2004  . PAP SMEAR-Modifier  01/23/2011  . INFLUENZA VACCINE  12/22/2018    Review of Systems  All other systems reviewed and are negative.   The following portions of the patient's history were reviewed and updated as appropriate: allergies, current medications, past family history, past medical history, past social history, past surgical history and problem list. Problem  list updated.  Objective:   Vitals:   01/21/19 0846  BP: 114/74  Weight: 246 lb 12.8 oz (111.9 kg)  Height: 5' 3.5" (1.613 m)    Physical Exam Vitals signs reviewed.  Constitutional:      General: She is not in acute distress.    Appearance: Normal appearance. She is obese.  HENT:     Head: Normocephalic and atraumatic.     Mouth/Throat:     Mouth: Mucous membranes are moist.     Pharynx: Oropharynx is clear. No oropharyngeal exudate or posterior oropharyngeal erythema.  Eyes:     Conjunctiva/sclera: Conjunctivae normal.  Neck:     Musculoskeletal: Neck supple.  Pulmonary:     Effort: Pulmonary effort is normal.  Abdominal:     Palpations: Abdomen is soft. There is no mass.     Tenderness: There is no guarding or rebound.  Genitourinary:    General: Normal vulva.     Rectum: Normal.     Comments: External genitalia/pubic area without nits, lice, edema, erythema, lesions, and inguinal adenopathy. Vagina with normal mucosa and small amount of white discharge, pH=>4.5.  Cervix without visible lesions. Uterus firm, mobile, nt, no masses, no CMT, no adnexal masses or fullness.  Lymphadenopathy:     Cervical: No cervical adenopathy.  Skin:    General: Skin is warm and dry.     Findings: No bruising, erythema, lesion or rash.  Neurological:  Mental Status: She is alert and oriented to person, place, and time.  Psychiatric:        Mood and Affect: Mood normal.        Behavior: Behavior normal.        Thought Content: Thought content normal.        Judgment: Judgment normal.       Assessment and Plan:  Michelle Fitzgerald is a 33 y.o. female presenting to the [facility] Department for an initial well woman exam/family planning visit  Contraception counseling: Reviewed all forms of birth control options available including abstinence; over the counter/barrier methods; hormonal contraceptive medication including pill, patch, ring, injection,contraceptive  implant; hormonal and nonhormonal IUDs; permanent sterilization options including vasectomy and the various tubal sterilization modalities. Risks and benefits reviewed.  Questions were answered.  Written information was also given to the patient to review.  Patient desires Nexplanon insertion, this was prescribed for patient. She will follow up in  3 months and prn for surveillance.  She was told to call with any further questions, or with any concerns about this method of contraception.  Emphasized use of condoms 100% of the time for STI prevention.  1. Encounter for counseling regarding contraception Counseled on [facility] options as above.  Patient opts to have Nexplanon insertion. Rec condoms with all sex for STD protection and especially for 10 days after insertion. - etonogestrel (NEXPLANON) implant 68 mg  2. Screening for STD (sexually transmitted disease) Await test results.  Counseled that RN will call if needs to RTC for any treatment once results are back.  - WET PREP FOR [place], YEAST, [facility]  3. Nexplanon insertion Nexplanon Insertion Procedure Patient identified, informed consent performed, consent signed.   Patient does understand that irregular bleeding is a very common side effect of this medication. She was advised to have backup contraception after placement. Patient was determined to meet WHO criteria for not being pregnant. Appropriate time out taken.  The insertion site was identified 8-10 cm (3-4 inches) from the medial epicondyle of the humerus and 3-5 cm (1.25-2 inches) posterior to (below) the sulcus (groove) between the biceps and triceps muscles of the patient's Right arm and marked.  Patient was prepped with alcohol swab and then injected with 3 ml of 1% lidocaine.  Arm was prepped with chlorhexidene, Nexplanon removed from packaging,  Device confirmed in needle, then inserted full length of needle and withdrawn per handbook instructions. Nexplanon  was able to palpated in the patient's arm; patient palpated the insert herself. There was minimal blood loss.  Patient insertion site covered with guaze and a pressure bandage to reduce any bruising.  The patient tolerated the procedure well and was given post procedure instructions. Counseled to take OTC analgesic as soon as lidocaine starts to wear off and take regularly for at least 48 hr with food or milk to decrease discomfort.  Specifically:  IB 600 mg (3 tablets) every 6 hrs or 800 mg (4 tablets) every 8 hrs or Aleve 2 po every 12 hrs. - etonogestrel (NEXPLANON) implant 68 mg  4. BV (bacterial vaginosis) Treat for BV with metronidazole 500mg  #14 1 po BID for 7 days with food, no EtOH for 24 hr before and until 72 hr after completing medicine. No sex for 7 days Counseled to use OTC antifungal cream if has s/s of itching during or just after treatment with antibiotic.      No follow-ups on file.  No  future appointments.  Matt Holmes, PA

## 2019-01-21 NOTE — Progress Notes (Signed)
Pt states she had surgery due to ruptured ectopic pregnancy on 01/02/2019.   Last sex approx November 13, 2018. Condoms declined, plans abstinence for maybe another month.

## 2019-01-21 NOTE — Progress Notes (Signed)
Wet mount reviewed by provider; per verbal will treat for BV per standing order. Provider posted pt and gave BV medication and counsel after removing nexplanon - see provider notes.

## 2019-01-30 ENCOUNTER — Encounter: Payer: Self-pay | Admitting: Surgical

## 2019-01-31 ENCOUNTER — Telehealth: Payer: Self-pay | Admitting: Obstetrics and Gynecology

## 2019-01-31 NOTE — Telephone Encounter (Signed)
The patient called and stated that she was supposed to receive a note from Dr. Amalia Hailey yesterday approving her to go back to work. Pt stated that she needs the letter today, Pt is returning to work tomorrow. Please advise.

## 2019-01-31 NOTE — Telephone Encounter (Signed)
Spoke with patient and let her know that letter was up front.

## 2019-09-13 ENCOUNTER — Encounter: Payer: Managed Care, Other (non HMO) | Admitting: Podiatry

## 2019-09-13 ENCOUNTER — Ambulatory Visit: Payer: Managed Care, Other (non HMO)

## 2019-09-13 DIAGNOSIS — M2012 Hallux valgus (acquired), left foot: Secondary | ICD-10-CM

## 2019-09-27 ENCOUNTER — Ambulatory Visit (INDEPENDENT_AMBULATORY_CARE_PROVIDER_SITE_OTHER): Payer: Managed Care, Other (non HMO)

## 2019-09-27 ENCOUNTER — Encounter: Payer: Self-pay | Admitting: Podiatry

## 2019-09-27 ENCOUNTER — Other Ambulatory Visit: Payer: Self-pay

## 2019-09-27 ENCOUNTER — Ambulatory Visit (INDEPENDENT_AMBULATORY_CARE_PROVIDER_SITE_OTHER): Payer: Managed Care, Other (non HMO) | Admitting: Podiatry

## 2019-09-27 VITALS — BP 105/62 | HR 66 | Resp 16 | Ht 63.0 in | Wt 240.0 lb

## 2019-09-27 DIAGNOSIS — M21622 Bunionette of left foot: Secondary | ICD-10-CM

## 2019-09-27 DIAGNOSIS — M659 Synovitis and tenosynovitis, unspecified: Secondary | ICD-10-CM | POA: Diagnosis not present

## 2019-09-27 DIAGNOSIS — S93401A Sprain of unspecified ligament of right ankle, initial encounter: Secondary | ICD-10-CM | POA: Diagnosis not present

## 2019-09-27 MED ORDER — MELOXICAM 15 MG PO TABS: 15.0000 mg | ORAL_TABLET | Freq: Every day | ORAL | 1 refills | Status: DC

## 2019-10-02 NOTE — Progress Notes (Signed)
   Subjective: 34 y.o. female presenting today with a chief complaint of sharp pain to the 5th metatarsal of the left foot that began a few months ago. She also reports sharp pain to the right ankle that began initially three months ago secondary to an injury. She states she re-injured the ankle two months ago for the second time. Walking and standing increases the pain. She has tried using an ankle brace for treatment. Patient is here for further evaluation and treatment.   No past medical history on file.   Objective: Physical Exam General: The patient is alert and oriented x3 in no acute distress.  Dermatology: Skin is cool, dry and supple bilateral lower extremities. Negative for open lesions or macerations.  Vascular: Palpable pedal pulses bilaterally. No edema or erythema noted. Capillary refill within normal limits.  Neurological: Epicritic and protective threshold grossly intact bilaterally.   Musculoskeletal Exam: Clinical evidence of Tailor's bunion deformity noted to the respective foot. There is a moderate pain on palpation range of motion of the fifth MPJ.  Pain with palpation noted to the anterior, lateral and medial aspects of the right ankle joint.   Radiographic Exam: Increased intermetatarsal angle to the fourth interspace of the respective foot. Prominent fifth metatarsal head. Joint spaces preserved.   Assessment: 1. Tailor's bunion deformity left 2. Right ankle synovitis    Plan of Care:  1. Patient was evaluated. X-Rays reviewed.  2. Declined injections today.  3. Prescription for Meloxicam provided to patient. 4. Ankle brace dispensed for the right ankle.  5. Recommended wide fitting shoes.  6. Return to clinic in 3 months.   Part time supervisor at [employer].    Felecia Shelling, DPM [facility]  Dr. Felecia Shelling, DPM    [address]                                        [place], [place] [address]                Office [phone]    Fax [phone]

## 2019-10-11 ENCOUNTER — Encounter: Payer: Self-pay | Admitting: Podiatry

## 2019-10-11 ENCOUNTER — Other Ambulatory Visit: Payer: Self-pay

## 2019-10-11 ENCOUNTER — Ambulatory Visit (INDEPENDENT_AMBULATORY_CARE_PROVIDER_SITE_OTHER): Payer: Managed Care, Other (non HMO) | Admitting: Podiatry

## 2019-10-11 VITALS — Temp 97.5°F

## 2019-10-11 DIAGNOSIS — M21622 Bunionette of left foot: Secondary | ICD-10-CM | POA: Diagnosis not present

## 2019-10-11 DIAGNOSIS — M659 Synovitis and tenosynovitis, unspecified: Secondary | ICD-10-CM | POA: Diagnosis not present

## 2019-10-11 NOTE — Patient Instructions (Signed)
Pre-Operative Instructions  Congratulations, you have decided to take an important step towards improving your quality of life.  You can be assured that the doctors and staff at [facility] will be with you every step of the way.  Here are some important things you should know:  1. Plan to be at the surgery center/hospital at least 1 (one) hour prior to your scheduled time, unless otherwise directed by the surgical center/hospital staff.  You must have a responsible adult accompany you, remain during the surgery and drive you home.  Make sure you have directions to the surgical center/hospital to ensure you arrive on time. 2. If you are having surgery at [facility] or [facility] hospitals, you will need a copy of your medical history and physical form from your family physician within one month prior to the date of surgery. We will give you a form for your primary physician to complete.  3. We make every effort to accommodate the date you request for surgery.  However, there are times where surgery dates or times have to be moved.  We will contact you as soon as possible if a change in schedule is required.   4. No aspirin/ibuprofen for one week before surgery.  If you are on aspirin, any non-steroidal anti-inflammatory medications (Mobic, Aleve, Ibuprofen) should not be taken seven (7) days prior to your surgery.  You make take Tylenol for pain prior to surgery.  5. Medications - If you are taking daily heart and blood pressure medications, seizure, reflux, allergy, asthma, anxiety, pain or diabetes medications, make sure you notify the surgery center/hospital before the day of surgery so they can tell you which medications you should take or avoid the day of surgery. 6. No food or drink after midnight the night before surgery unless directed otherwise by surgical center/hospital staff. 7. No alcoholic beverages 24-hours prior to surgery.  No smoking 24-hours prior or 24-hours after  surgery. 8. Wear loose pants or shorts. They should be loose enough to fit over bandages, boots, and casts. 9. Don't wear slip-on shoes. Sneakers are preferred. 10. Bring your boot with you to the surgery center/hospital.  Also bring crutches or a walker if your physician has prescribed it for you.  If you do not have this equipment, it will be provided for you after surgery. 11. If you have not been contacted by the surgery center/hospital by the day before your surgery, call to confirm the date and time of your surgery. 12. Leave-time from work may vary depending on the type of surgery you have.  Appropriate arrangements should be made prior to surgery with your employer. 13. Prescriptions will be provided immediately following surgery by your doctor.  Fill these as soon as possible after surgery and take the medication as directed. Pain medications will not be refilled on weekends and must be approved by the doctor. 14. Remove nail polish on the operative foot and avoid getting pedicures prior to surgery. 15. Wash the night before surgery.  The night before surgery wash the foot and leg well with water and the antibacterial soap provided. Be sure to pay special attention to beneath the toenails and in between the toes.  Wash for at least three (3) minutes. Rinse thoroughly with water and dry well with a towel.  Perform this wash unless told not to do so by your physician.  Enclosed: 1 Ice pack (please put in freezer the night before surgery)   1 Hibiclens skin cleaner     Pre-op instructions  If you have any questions regarding the instructions, please do not hesitate to call our office.  [place]: [address], [place], [place] [address] -- [phone]  [place]: [address] [place], [place] [address] -- [phone]  [place]: [address], [place], [place] [address] -- [phone]   Website: [email or web address] 

## 2019-10-13 NOTE — Progress Notes (Signed)
   Subjective: 34 y.o. female presenting today for follow up evaluation of a Tailor's bunion deformity of the left foot and right ankle pain. She states the right ankle pain has improved significantly because of the ankle brace. She reports continued pain in the left foot secondary to the Tailor's bunion and would like to discuss surgery. Walking and wearing shoes increases the pain. Patient is here for further evaluation and treatment.   No past medical history on file.   Objective: Physical Exam General: The patient is alert and oriented x3 in no acute distress.  Dermatology: Skin is cool, dry and supple bilateral lower extremities. Negative for open lesions or macerations.  Vascular: Palpable pedal pulses bilaterally. No edema or erythema noted. Capillary refill within normal limits.  Neurological: Epicritic and protective threshold grossly intact bilaterally.   Musculoskeletal Exam: Clinical evidence of Tailor's bunion deformity noted to the respective foot. There is a moderate pain on palpation range of motion of the fifth MPJ.  Pain with palpation noted to the anterior, lateral and medial aspects of the right ankle joint.   Assessment: 1. Tailor's bunion deformity left 2. Right ankle synovitis    Plan of Care:  1. Patient was evaluated. 2. Continue taking Meloxicam and using the ankle brace right.  3. Today we discussed the conservative versus surgical management of the presenting pathology. The patient opts for surgical management. All possible complications and details of the procedure were explained. All patient questions were answered. No guarantees were expressed or implied. 4. Authorization for surgery was initiated today. Surgery will consist of Tailor's bunionectomy with osteotomy left.  5. Return to clinic one week post op.   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    Felecia Shelling, DPM [facility]  Dr. Felecia Shelling, DPM    [address]                                        [place], [place] [address]                Office [phone]  Fax [phone]

## 2019-10-29 NOTE — Progress Notes (Signed)
This encounter was created in error - please disregard.

## 2019-11-06 ENCOUNTER — Telehealth: Payer: Self-pay

## 2019-11-06 NOTE — Telephone Encounter (Signed)
DOS 11/28/19  METATARSAL OSTEOTOMY LT - 04136  [employer] EFFECTIVE DATE - 12/30/2018  PLAN DEDUCTIBLE - $2000.00 W/ $4383.77 MET OUT OF POCKET - $4000.00 W/ $1018.96 MET COPAY $0.00 COINSURANCE - 80%  PER JOE AT [employer], NO PRECERT REQUIRED FOR CPT 28308. CALL [phone]

## 2019-11-20 ENCOUNTER — Telehealth: Payer: Self-pay

## 2019-11-20 NOTE — Telephone Encounter (Signed)
Michelle Fitzgerald called to cancel her surgery for 11/28/19. She stated her grandmother fell and got hurt and she has to take care of her. She stated she will call back to reschedule once she gets her grandmother taken care of. I notified Aram Beecham at [facility].

## 2019-12-04 ENCOUNTER — Encounter: Payer: Managed Care, Other (non HMO) | Admitting: Podiatry

## 2019-12-06 ENCOUNTER — Encounter: Payer: Managed Care, Other (non HMO) | Admitting: Podiatry

## 2019-12-11 ENCOUNTER — Encounter: Payer: Managed Care, Other (non HMO) | Admitting: Podiatry

## 2019-12-12 ENCOUNTER — Encounter: Payer: Managed Care, Other (non HMO) | Admitting: Podiatry

## 2019-12-27 ENCOUNTER — Encounter: Payer: Managed Care, Other (non HMO) | Admitting: Podiatry

## 2019-12-30 ENCOUNTER — Encounter: Payer: Managed Care, Other (non HMO) | Admitting: Podiatry

## 2019-12-31 ENCOUNTER — Ambulatory Visit: Payer: Managed Care, Other (non HMO) | Admitting: Podiatry

## 2020-01-03 ENCOUNTER — Ambulatory Visit: Payer: Managed Care, Other (non HMO) | Admitting: Podiatry

## 2020-09-03 IMAGING — US OBSTETRIC <14 WK ULTRASOUND
2 series · 13 of 28 positions shown · non-contrast
Comparison: None.

CLINICAL DATA: Pelvic pain and vaginal bleeding

EXAM:
OBSTETRIC <14 WK US AND TRANSVAGINAL OB US
TECHNIQUE: Both transabdominal and transvaginal ultrasound examinations were
performed for complete evaluation of the gestation as well as the
maternal uterus, adnexal regions, and pelvic cul-de-sac.
Transvaginal technique was performed to assess early pregnancy.

[Series 1: obstetric <14 wk ultrasound · 82 acquisitions, 11 frames shown (1 of 2)]
[im 4/82]
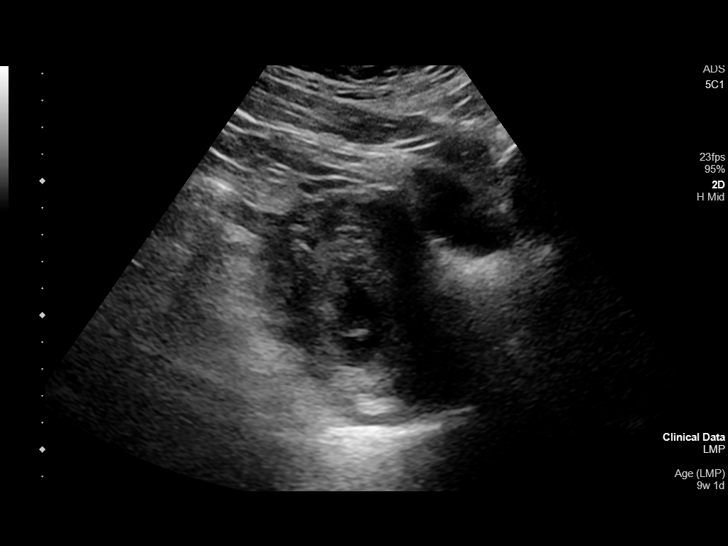
[im 11/82]
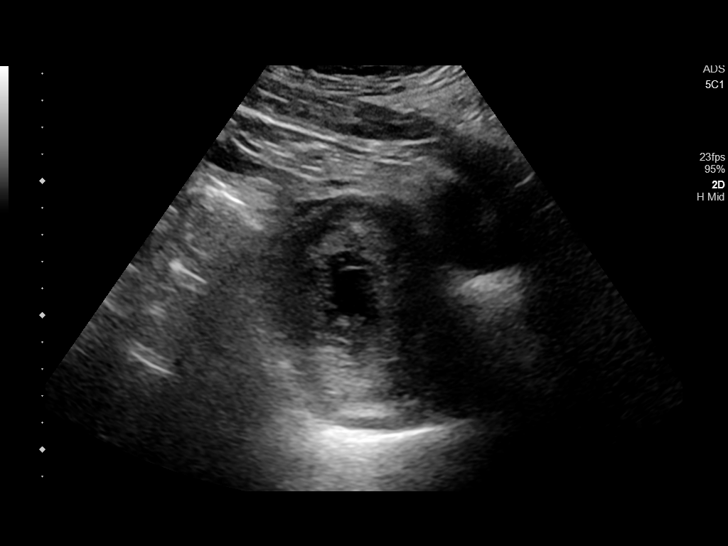
[im 18/82]
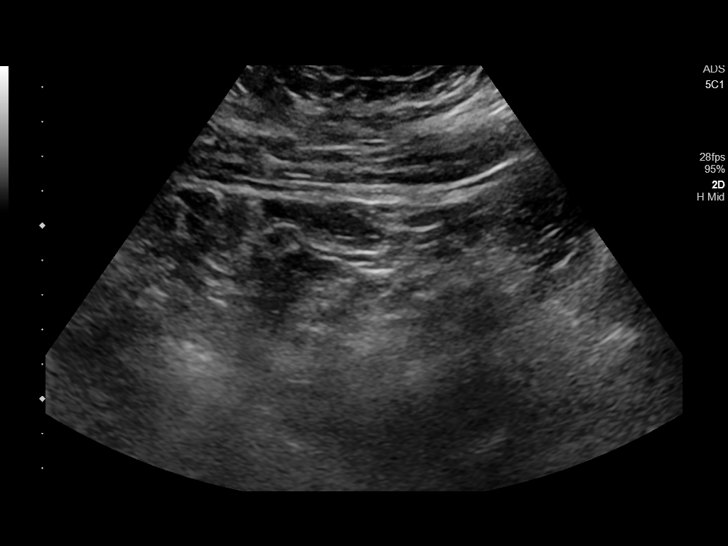
[im 25/82]
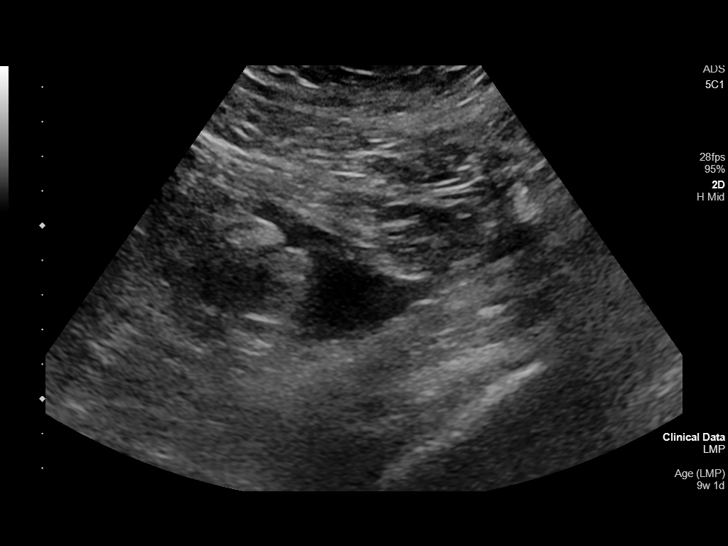
[im 32/82]
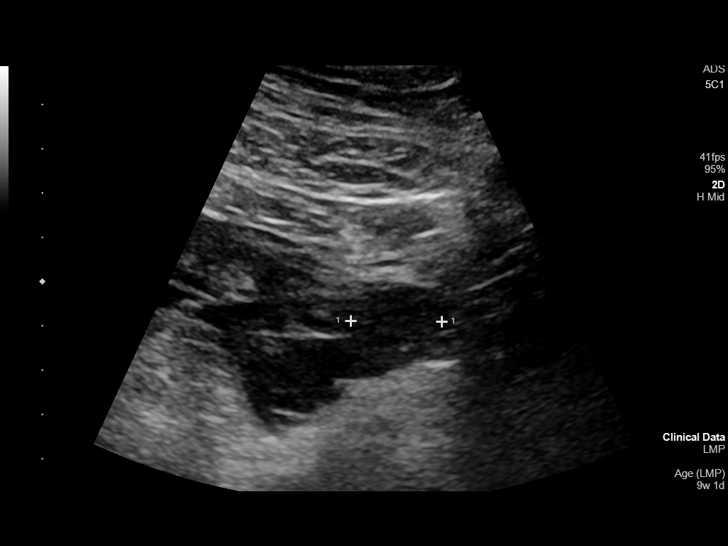
[im 39/82]
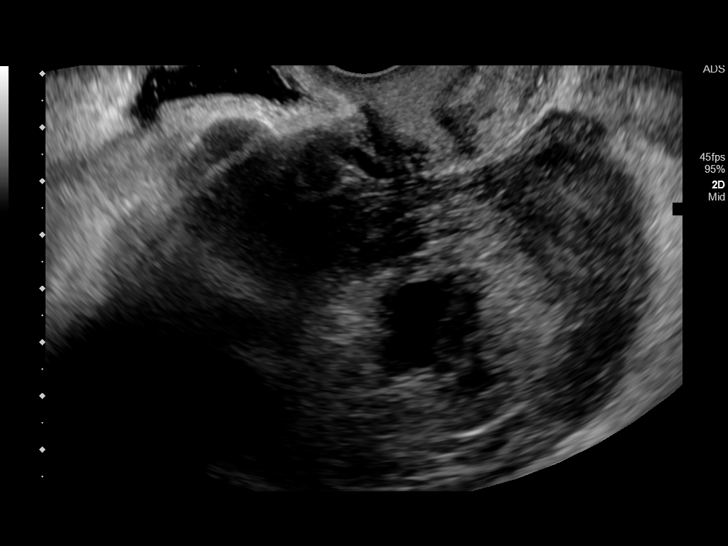
[im 50/82]
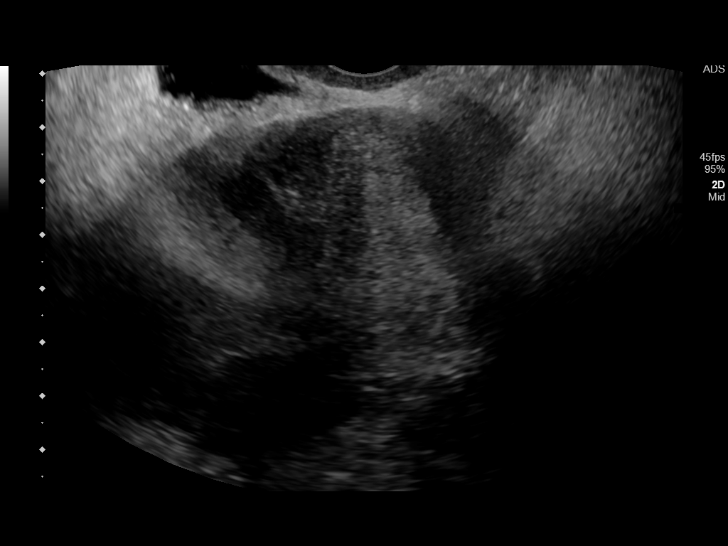
[im 57/82]
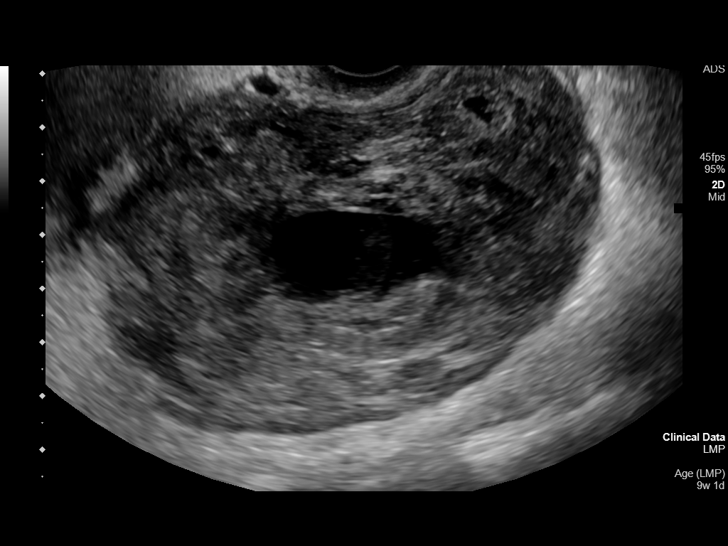
[im 64/82]
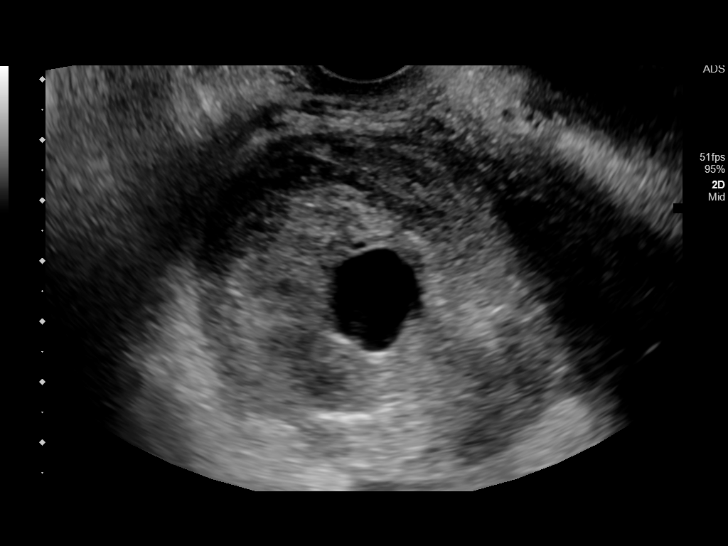
[im 71/82]
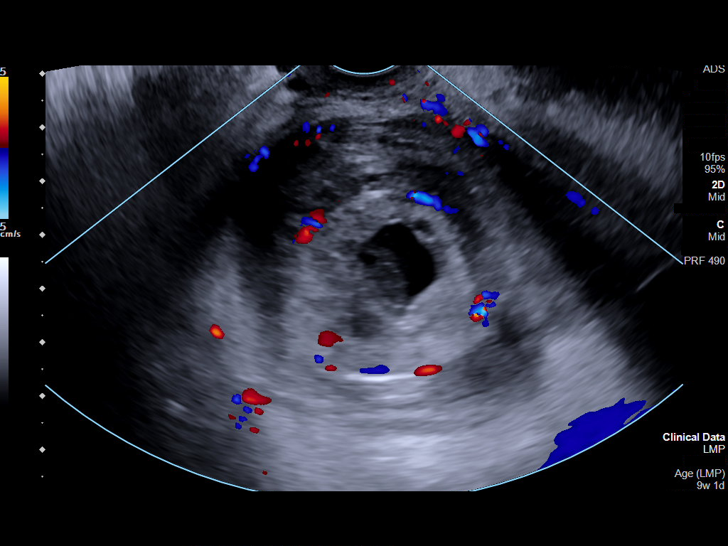
[im 78/82]
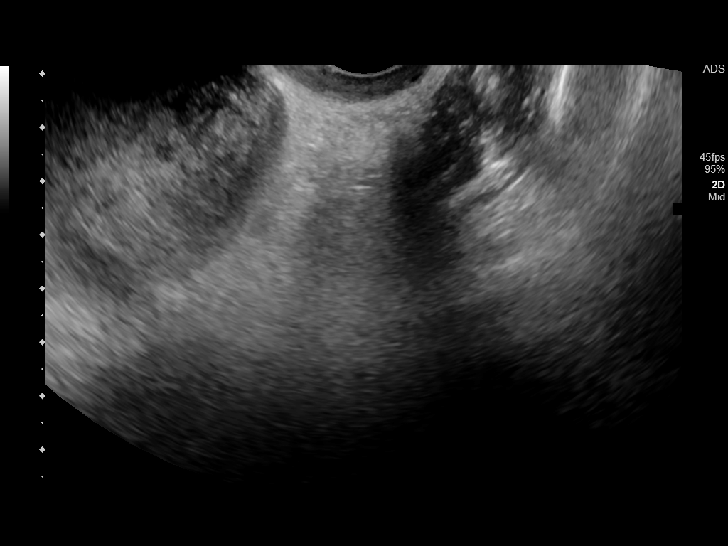

[Series 1001: obstetric <14 wk ultrasound · 2 of 14 slices shown (2 of 2)]
[im 1/14]
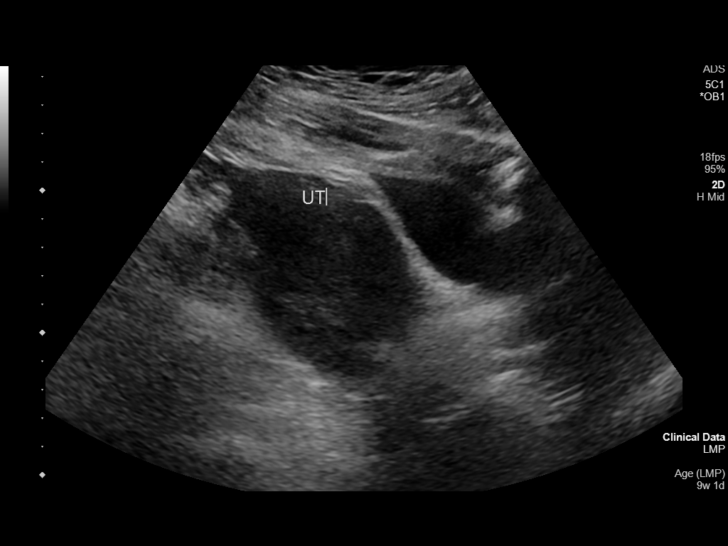
[im 9/14]
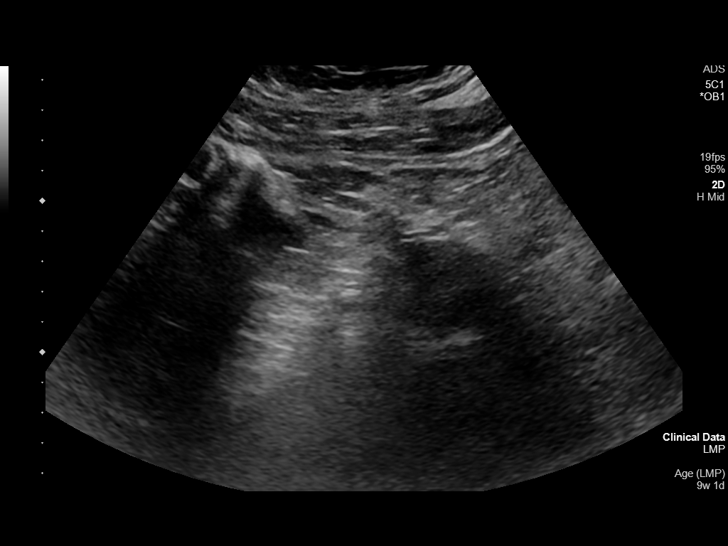

[13 of 28 positions shown; findings below may reference images not displayed]

FINDINGS: Intrauterine gestational sac: None

Yolk sac:  Not Visualized.

Embryo:  Visualized.

Cardiac Activity: Not Visualized.

Heart Rate:

CRL:  15.5 mm   7 w   6 d

Subchorionic hemorrhage:  None visualized.

Maternal uterus/adnexae: Within the right adnexa separate from the
uterus there is a complex large mass with a gestational sac and
embryo seen. The mass appears to be either within or adjacent to the
right ovary which is not clearly visualized separate. There is a
small amount of free fluid seen adjacent to the complex mass. No
intrauterine pregnancy is seen. There is a small hypoechoic fibroid
in lower uterine segment measuring 1.6 x 1.5 x 1.5 cm. The left
ovary is not visualized.
IMPRESSION: Findings concerning for right adnexal/ovarian ectopic with possible
rupture. No intrauterine pregnancy seen.

These results were called by telephone at the time of interpretation
on 01/01/2019 at [DATE] to Dr. INDRA IR VYTUKAS MUSAJEV , who verbally
acknowledged these results.

## 2021-10-29 ENCOUNTER — Ambulatory Visit: Payer: 59

## 2021-10-29 ENCOUNTER — Ambulatory Visit: Payer: Self-pay

## 2021-11-25 ENCOUNTER — Encounter: Payer: Self-pay | Admitting: Nurse Practitioner

## 2021-11-25 ENCOUNTER — Ambulatory Visit (INDEPENDENT_AMBULATORY_CARE_PROVIDER_SITE_OTHER): Payer: 59 | Admitting: Nurse Practitioner

## 2021-11-25 VITALS — BP 136/83 | HR 64 | Ht 62.99 in | Wt 249.8 lb

## 2021-11-25 DIAGNOSIS — R5383 Other fatigue: Secondary | ICD-10-CM

## 2021-11-25 DIAGNOSIS — E559 Vitamin D deficiency, unspecified: Secondary | ICD-10-CM

## 2021-11-25 DIAGNOSIS — Z0000 Encounter for general adult medical examination without abnormal findings: Secondary | ICD-10-CM

## 2021-11-25 DIAGNOSIS — Z6841 Body Mass Index (BMI) 40.0 and over, adult: Secondary | ICD-10-CM

## 2021-11-25 DIAGNOSIS — R635 Abnormal weight gain: Secondary | ICD-10-CM | POA: Insufficient documentation

## 2021-11-25 DIAGNOSIS — Z803 Family history of malignant neoplasm of breast: Secondary | ICD-10-CM | POA: Insufficient documentation

## 2021-11-25 DIAGNOSIS — R1013 Epigastric pain: Secondary | ICD-10-CM

## 2021-11-25 DIAGNOSIS — R1114 Bilious vomiting: Secondary | ICD-10-CM | POA: Diagnosis not present

## 2021-11-25 DIAGNOSIS — Z1231 Encounter for screening mammogram for malignant neoplasm of breast: Secondary | ICD-10-CM

## 2021-11-25 NOTE — Progress Notes (Signed)
New Patient Office Visit  Subjective    Patient ID: Michelle Fitzgerald, female    DOB: 04-03-1986  Age: 36 y.o. MRN: [id number]  CC:  Chief Complaint  Patient presents with   New Patient (Initial Visit)    HPI Michelle Fitzgerald presents to establish care -the patient has not had PCP in some time.  -has seen health department for well woman care until recently. Does have Nexplanon implant for birth control. Will be gong back to them to have this removed. Has spotting periods nearly all of the time.  -reports pain in epigastric area. States that she has constipation for a few days, then has loose stools.  -has frequent vomiting. Vomiting is mostly in the morning. When she does vomit, she throws up food she has eaten the day before.  -feels like she "sleep eats." States that she does find herself eating very late at night when she is half asleep. She states that she feels like she does not have control over it.  -she did have some upper right back pain which was right sided which radiated around to the right side, just under the right breast. This has mostly resolved . -feels a lump in the right breast   Outpatient Encounter Medications as of 11/25/2021  Medication Sig   etonogestrel (NEXPLANON) 68 MG IMPL implant Inject into the skin.   [DISCONTINUED] meloxicam (MOBIC) 15 MG tablet Take 1 tablet (15 mg total) by mouth daily.   [DISCONTINUED] metroNIDAZOLE (FLAGYL) 500 MG tablet Take 1 tablet (500 mg total) by mouth 2 (two) times daily. (Patient not taking: Reported on 09/27/2019)   [DISCONTINUED] valACYclovir (VALTREX) 1000 MG tablet Take by mouth.   No facility-administered encounter medications on file as of 11/25/2021.    History reviewed. No pertinent past medical history.  Past Surgical History:  Procedure Laterality Date   DIAGNOSTIC LAPAROSCOPY WITH REMOVAL OF ECTOPIC PREGNANCY N/A 01/01/2019   Procedure: DIAGNOSTIC LAPAROSCOPY WITH REMOVAL OF ECTOPIC PREGNANCY;  Surgeon: Linzie Collin, MD;  Location: [facility];  Service: Gynecology;  Laterality: N/A;   FINGER TENDON REPAIR Right    right middle finger   LUMBAR LAMINECTOMY/DECOMPRESSION MICRODISCECTOMY N/A 06/14/2017   Procedure: LUMBAR LAMINECTOMY/DECOMPRESSION MICRODISCECTOMY 2 LEVELS-L4-5,L5-S1;  Surgeon: Venetia Night, MD;  Location: [facility];  Service: Neurosurgery;  Laterality: N/A;   TONSILLECTOMY      Family History  Problem Relation Age of Onset   Diabetes Father    Diabetes Maternal Grandmother    Cancer Maternal Grandmother    Hypertension Maternal Grandmother    Stroke Maternal Grandmother    Hypertension Maternal Grandfather    Stroke Maternal Grandfather    Heart disease Maternal Grandfather     Social History   Socioeconomic History   Marital status: Single    Spouse name: Not on file   Number of children: Not on file   Years of education: Not on file   Highest education level: Not on file  Occupational History   Not on file  Tobacco Use   Smoking status: Some Days    Packs/day: 1.00    Types: Cigarettes   Smokeless tobacco: Never  Vaping Use   Vaping Use: Former  Substance and Sexual Activity   Alcohol use: No   Drug use: No   Sexual activity: Yes    Birth control/protection: Condom  Other Topics Concern   Not on file  Social History Narrative   Not on file   Social Determinants of Health  Financial Resource Strain: Not on file  Food Insecurity: Not on file  Transportation Needs: Not on file  Physical Activity: Not on file  Stress: Not on file  Social Connections: Not on file  Intimate Partner Violence: Not on file    Review of Systems  Constitutional:  Positive for malaise/fatigue. Negative for chills and fever.  HENT:  Negative for congestion, sinus pain and sore throat.   Eyes: Negative.   Respiratory:  Negative for cough, shortness of breath and wheezing.   Cardiovascular:  Negative for chest pain, palpitations and leg swelling.  Gastrointestinal:   Positive for abdominal pain, diarrhea, heartburn, nausea and vomiting. Negative for constipation.  Genitourinary: Negative.   Musculoskeletal:  Positive for back pain. Negative for myalgias.       Pain is mostly upper right part of the back.   Skin: Negative.   Neurological:  Negative for dizziness and headaches.  Endo/Heme/Allergies:  Does not bruise/bleed easily.  Psychiatric/Behavioral:  Negative for depression. The patient is not nervous/anxious.         Objective    Today's Vitals   11/25/21 0848  BP: 136/83  Pulse: 64  SpO2: 97%  Weight: 249 lb 12.8 oz (113.3 kg)  Height: 5' 2.99" (1.6 m)   Body mass index is 44.26 kg/m.   Physical Exam Vitals and nursing note reviewed.  Constitutional:      Appearance: Normal appearance. She is well-developed. She is obese.  HENT:     Head: Normocephalic and atraumatic.     Nose: Nose normal.     Mouth/Throat:     Mouth: Mucous membranes are moist.     Pharynx: Oropharynx is clear.  Eyes:     Extraocular Movements: Extraocular movements intact.     Conjunctiva/sclera: Conjunctivae normal.     Pupils: Pupils are equal, round, and reactive to light.  Cardiovascular:     Rate and Rhythm: Normal rate and regular rhythm.     Pulses: Normal pulses.     Heart sounds: Normal heart sounds.  Pulmonary:     Effort: Pulmonary effort is normal.     Breath sounds: Normal breath sounds.  Abdominal:     General: Bowel sounds are normal. There is no distension.     Palpations: Abdomen is soft. There is no mass.     Tenderness: There is abdominal tenderness. There is guarding. There is no right CVA tenderness, left CVA tenderness or rebound.     Comments: Patient with abdominal pain in epigastric area. Worse with palpation. No mass noted. Guarding noted.   Musculoskeletal:        General: Normal range of motion.     Cervical back: Normal range of motion and neck supple.  Lymphadenopathy:     Cervical: No cervical adenopathy.  Skin:     General: Skin is warm and dry.     Capillary Refill: Capillary refill takes less than 2 seconds.  Neurological:     General: No focal deficit present.     Mental Status: She is alert and oriented to person, place, and time.  Psychiatric:        Mood and Affect: Mood normal.        Behavior: Behavior normal.        Thought Content: Thought content normal.        Judgment: Judgment normal.        Assessment & Plan:  1. Epigastric pain Mid upper abdominal pain. Positive murphy's sign with palpation. Will get ultrasound  of the abdomen for further evaluation. Check CMP, amylase, and lipase.  - Comprehensive metabolic panel; Future - Amylase - Lipase - US Abdomen Complete; Future - Comprehensive metabolic panel  2. Bilious vomiting with nausea Concern for gallbladder disorder or delayed gastric emptying. Will get ultrasound of the abdomen for further evaluation. Consider referral to GI.   3. Abnormal weight gain Check thyroid panel and fasting lipids today  - TSH + free T4; Future - Lipid panel; Future - Lipid panel - TSH + free T4  4. Other fatigue Check labs for further evaluation.  - TSH + free T4; Future - Hemoglobin A1c; Future - Comprehensive metabolic panel; Future - CBC; Future - CBC - Comprehensive metabolic panel - Hemoglobin A1c - TSH + free T4  5. Vitamin D deficiency Check vitamin d and treat deficiency as indicated.  - VITAMIN D 25 Hydroxy (Vit-D Deficiency, Fractures); Future - Hemoglobin A1c; Future - Hemoglobin A1c - VITAMIN D 25 Hydroxy (Vit-D Deficiency, Fractures)  6. BMI 40.0-44.9, adult (HCC) Discussed lowering calorie intake to 1500 calories per day and incorporating exercise into daily routine to help lose weight.   7. Family history of breast cancer Patient's grandmother with positive history of breast cancer. Will get baseline screening mammogram  - MM 3D SCREEN BREAST BILATERAL; Future  8. Encounter for screening mammogram for malignant  neoplasm of breast Baseline screening mammogram ordered today  - MM 3D SCREEN BREAST BILATERAL; Future  9. Healthcare maintenance Routine, fasting labs drawn during today's visit.  - Hemoglobin A1c; Future - Lipid panel; Future - Comprehensive metabolic panel; Future - CBC; Future - CBC - Comprehensive metabolic panel - Lipid panel - Hemoglobin A1c   Problem List Items Addressed This Visit       Digestive   Bilious vomiting with nausea     Other   Epigastric pain - Primary   Relevant Orders   Comprehensive metabolic panel   Amylase   Lipase   US Abdomen Complete   Abnormal weight gain   Relevant Orders   TSH + free T4   Lipid panel   Other fatigue   Relevant Orders   TSH + free T4   Hemoglobin A1c   Comprehensive metabolic panel   CBC   Vitamin D deficiency   Relevant Orders   VITAMIN D 25 Hydroxy (Vit-D Deficiency, Fractures)   Hemoglobin A1c   BMI 40.0-44.9, adult (HCC)   Family history of breast cancer   Relevant Orders   MM 3D SCREEN BREAST BILATERAL   Other Visit Diagnoses     Encounter for screening mammogram for malignant neoplasm of breast       Relevant Orders   MM 3D SCREEN BREAST BILATERAL   Healthcare maintenance       Relevant Orders   Hemoglobin A1c   Lipid panel   Comprehensive metabolic panel   CBC       Return in about 4 weeks (around 12/23/2021) for health maintenance exam, with pap.   Carlean Jews, NP

## 2021-11-26 LAB — LIPASE: Lipase: 23 U/L (ref 14–72)

## 2021-11-26 LAB — CBC
Hematocrit: 42.5 % (ref 34.0–46.6)
Hemoglobin: 14.3 g/dL (ref 11.1–15.9)
MCH: 28.8 pg (ref 26.6–33.0)
MCHC: 33.6 g/dL (ref 31.5–35.7)
MCV: 86 fL (ref 79–97)
Platelets: 315 10*3/uL (ref 150–450)
RBC: 4.97 x10E6/uL (ref 3.77–5.28)
RDW: 11.9 % (ref 11.7–15.4)
WBC: 8.2 10*3/uL (ref 3.4–10.8)

## 2021-11-26 LAB — COMPREHENSIVE METABOLIC PANEL
ALT: 28 IU/L (ref 0–32)
AST: 18 IU/L (ref 0–40)
Albumin/Globulin Ratio: 2 (ref 1.2–2.2)
Albumin: 4.3 g/dL (ref 3.8–4.8)
Alkaline Phosphatase: 69 IU/L (ref 44–121)
BUN/Creatinine Ratio: 19 (ref 9–23)
BUN: 14 mg/dL (ref 6–20)
Bilirubin Total: 0.4 mg/dL (ref 0.0–1.2)
CO2: 20 mmol/L (ref 20–29)
Calcium: 9.2 mg/dL (ref 8.7–10.2)
Chloride: 106 mmol/L (ref 96–106)
Creatinine, Ser: 0.75 mg/dL (ref 0.57–1.00)
Globulin, Total: 2.1 g/dL (ref 1.5–4.5)
Glucose: 98 mg/dL (ref 70–99)
Potassium: 4.6 mmol/L (ref 3.5–5.2)
Sodium: 140 mmol/L (ref 134–144)
Total Protein: 6.4 g/dL (ref 6.0–8.5)
eGFR: 106 mL/min/{1.73_m2} (ref >59)

## 2021-11-26 LAB — HEMOGLOBIN A1C
Est. average glucose Bld gHb Est-mCnc: 105 mg/dL
Hgb A1c MFr Bld: 5.3 % (ref 4.8–5.6)

## 2021-11-26 LAB — LIPID PANEL
Chol/HDL Ratio: 4.6 ratio — ABNORMAL HIGH (ref 0.0–4.4)
Cholesterol, Total: 202 mg/dL — ABNORMAL HIGH (ref 100–199)
HDL: 44 mg/dL (ref >39)
LDL Chol Calc (NIH): 145 mg/dL — ABNORMAL HIGH (ref 0–99)
Triglycerides: 74 mg/dL (ref 0–149)
VLDL Cholesterol Cal: 13 mg/dL (ref 5–40)

## 2021-11-26 LAB — TSH+FREE T4
Free T4: 1.06 ng/dL (ref 0.82–1.77)
TSH: 1.62 u[IU]/mL (ref 0.450–4.500)

## 2021-11-26 LAB — VITAMIN D 25 HYDROXY (VIT D DEFICIENCY, FRACTURES): Vit D, 25-Hydroxy: 17.5 ng/mL — ABNORMAL LOW (ref 30.0–100.0)

## 2021-11-26 LAB — AMYLASE: Amylase: 47 U/L (ref 31–110)

## 2021-12-03 ENCOUNTER — Ambulatory Visit
Admission: RE | Admit: 2021-12-03 | Discharge: 2021-12-03 | Disposition: A | Discharge status: Home / Self Care | Payer: 59 | Source: Ambulatory Visit | Attending: Nurse Practitioner | Admitting: Nurse Practitioner

## 2021-12-03 DIAGNOSIS — Z1231 Encounter for screening mammogram for malignant neoplasm of breast: Secondary | ICD-10-CM

## 2021-12-03 DIAGNOSIS — Z803 Family history of malignant neoplasm of breast: Secondary | ICD-10-CM

## 2021-12-03 DIAGNOSIS — R1013 Epigastric pain: Secondary | ICD-10-CM

## 2021-12-05 NOTE — Progress Notes (Signed)
Mild elevation of lipid panel. Vitamin d deficiency. Discuss with patient at visit 12/24/2021

## 2021-12-05 NOTE — Progress Notes (Signed)
Please let the patient know that her ultrasound was normal. Thanks  -HB

## 2021-12-06 ENCOUNTER — Other Ambulatory Visit: Payer: Self-pay | Admitting: Nurse Practitioner

## 2021-12-06 DIAGNOSIS — N6310 Unspecified lump in the right breast, unspecified quadrant: Secondary | ICD-10-CM

## 2021-12-16 ENCOUNTER — Ambulatory Visit: Payer: 59

## 2021-12-22 ENCOUNTER — Other Ambulatory Visit: Payer: 59

## 2021-12-23 NOTE — Progress Notes (Signed)
Complete physical exam   Patient: Michelle Fitzgerald   DOB: 02/15/1986   36 y.o. Female  MRN: [id number] Visit Date: 12/24/2021    Chief Complaint  Patient presents with   Follow-up   Subjective    Michelle Fitzgerald is a 36 y.o. female who presents today for a complete physical exam.  She reports consuming a  generally healthy  diet. The patient does not participate in regular exercise at present. She generally feels well. She does have additional problems to discuss today.   HPI  Annual physical  -routine labs done  --vitamin d deficiency - needs to be treated with drisdol  --mild, generalized elevation of lipid panel  --other labs normal  -reports seeing health department. For well woman care -Does have Nexplanon for birth control. She is due to have this removed and pap smear in September.  -abdominal ultrasound since her last visit -negative for abnormalities - amylase and lipase were both normal.  -Chronic right abdominal pain. Frequent diarrhea and vomiting. Has noted blood in stool.  -right shoulder pain. This is most severe when right arm is outstretched and when she crosses it in front of her body.   No past medical history on file. Past Surgical History:  Procedure Laterality Date   DIAGNOSTIC LAPAROSCOPY WITH REMOVAL OF ECTOPIC PREGNANCY N/A 01/01/2019   Procedure: DIAGNOSTIC LAPAROSCOPY WITH REMOVAL OF ECTOPIC PREGNANCY;  Surgeon: Harlin Heys, MD;  Location: [facility];  Service: Gynecology;  Laterality: N/A;   FINGER TENDON REPAIR Right    right middle finger   LUMBAR LAMINECTOMY/DECOMPRESSION MICRODISCECTOMY N/A 06/14/2017   Procedure: LUMBAR LAMINECTOMY/DECOMPRESSION MICRODISCECTOMY 2 LEVELS-L4-5,L5-S1;  Surgeon: Meade Maw, MD;  Location: [facility];  Service: Neurosurgery;  Laterality: N/A;   TONSILLECTOMY     Social History   Socioeconomic History   Marital status: Single    Spouse name: Not on file   Number of children: Not on file   Years of  education: Not on file   Highest education level: Not on file  Occupational History   Not on file  Tobacco Use   Smoking status: Some Days    Packs/day: 1.00    Types: Cigarettes   Smokeless tobacco: Never  Vaping Use   Vaping Use: Former  Substance and Sexual Activity   Alcohol use: No   Drug use: No   Sexual activity: Yes    Birth control/protection: Condom  Other Topics Concern   Not on file  Social History Narrative   Not on file   Social Determinants of Health   Financial Resource Strain: Not on file  Food Insecurity: Not on file  Transportation Needs: Not on file  Physical Activity: Not on file  Stress: Not on file  Social Connections: Not on file  Intimate Partner Violence: Not on file   Family Status  Relation Name Status   Father  (Not Specified)   MGM  (Not Specified)   MGF  (Not Specified)   Family History  Problem Relation Age of Onset   Diabetes Father    Diabetes Maternal Grandmother    Cancer Maternal Grandmother    Hypertension Maternal Grandmother    Stroke Maternal Grandmother    Hypertension Maternal Grandfather    Stroke Maternal Grandfather    Heart disease Maternal Grandfather    No Known Allergies  Patient Care Team: Ronnell Freshwater, NP as PCP - General (Family Medicine)   Medications: Outpatient Medications Prior to Visit  Medication Sig   etonogestrel (NEXPLANON)  68 MG IMPL implant Inject into the skin.   No facility-administered medications prior to visit.    Review of Systems  Last CBC Lab Results  Component Value Date   WBC 8.2 11/25/2021   HGB 14.3 11/25/2021   HCT 42.5 11/25/2021   MCV 86 11/25/2021   MCH 28.8 11/25/2021   RDW 11.9 11/25/2021   PLT 315 63/84/6659   Last metabolic panel Lab Results  Component Value Date   GLUCOSE 98 11/25/2021   NA 140 11/25/2021   K 4.6 11/25/2021   CL 106 11/25/2021   CO2 20 11/25/2021   BUN 14 11/25/2021   CREATININE 0.75 11/25/2021   EGFR 106 11/25/2021   CALCIUM  9.2 11/25/2021   PROT 6.4 11/25/2021   ALBUMIN 4.3 11/25/2021   LABGLOB 2.1 11/25/2021   AGRATIO 2.0 11/25/2021   BILITOT 0.4 11/25/2021   ALKPHOS 69 11/25/2021   AST 18 11/25/2021   ALT 28 11/25/2021   ANIONGAP 9 01/01/2019   Last lipids Lab Results  Component Value Date   CHOL 202 (H) 11/25/2021   HDL 44 11/25/2021   LDLCALC 145 (H) 11/25/2021   TRIG 74 11/25/2021   CHOLHDL 4.6 (H) 11/25/2021   Last hemoglobin A1c Lab Results  Component Value Date   HGBA1C 5.3 11/25/2021   Last thyroid functions Lab Results  Component Value Date   TSH 1.620 11/25/2021   Last vitamin D Lab Results  Component Value Date   VD25OH 17.5 (L) 11/25/2021       Objective     Today's Vitals   12/24/21 1028  BP: 134/86  Pulse: 81  Temp: 97.9 F (36.6 C)  TempSrc: Temporal  Weight: 246 lb (111.6 kg)  Height: 5' 3" (1.6 m)   Body mass index is 43.58 kg/m.   BP Readings from Last 3 Encounters:  12/24/21 134/86  11/25/21 136/83  09/27/19 105/62    Wt Readings from Last 3 Encounters:  12/24/21 246 lb (111.6 kg)  11/25/21 249 lb 12.8 oz (113.3 kg)  09/27/19 240 lb (108.9 kg)     Physical Exam Vitals and nursing note reviewed.  Constitutional:      Appearance: Normal appearance. She is well-developed. She is obese.  HENT:     Head: Normocephalic and atraumatic.     Right Ear: Tympanic membrane, ear canal and external ear normal.     Left Ear: Tympanic membrane, ear canal and external ear normal.     Nose: Nose normal.     Mouth/Throat:     Mouth: Mucous membranes are moist.     Pharynx: Oropharynx is clear.  Eyes:     Extraocular Movements: Extraocular movements intact.     Conjunctiva/sclera: Conjunctivae normal.     Pupils: Pupils are equal, round, and reactive to light.  Cardiovascular:     Rate and Rhythm: Normal rate and regular rhythm.     Pulses: Normal pulses.     Heart sounds: Normal heart sounds.  Pulmonary:     Effort: Pulmonary effort is normal.      Breath sounds: Normal breath sounds.  Abdominal:     General: Bowel sounds are normal. There is no distension.     Palpations: Abdomen is soft. There is no mass.     Tenderness: There is no abdominal tenderness. There is no right CVA tenderness, left CVA tenderness, guarding or rebound.     Hernia: No hernia is present.  Musculoskeletal:        General: Normal range of  motion.     Cervical back: Normal range of motion and neck supple.  Lymphadenopathy:     Cervical: No cervical adenopathy.  Skin:    General: Skin is warm and dry.     Capillary Refill: Capillary refill takes less than 2 seconds.  Neurological:     General: No focal deficit present.     Mental Status: She is alert and oriented to person, place, and time.  Psychiatric:        Mood and Affect: Mood normal.        Behavior: Behavior normal.        Thought Content: Thought content normal.        Judgment: Judgment normal.      Last depression screening scores    12/24/2021   10:34 AM 11/25/2021    8:54 AM  PHQ 2/9 Scores  PHQ - 2 Score 2 2  PHQ- 9 Score 7 9   Last fall risk screening    12/24/2021   10:34 AM  [place] in the past year? 0  Number falls in past yr: 0  Injury with Fall? 0  Risk for fall due to : No Fall Risks  Follow up Falls evaluation completed     Assessment & Plan    1. Encounter for general adult medical examination with abnormal findings Annual physical today  2. Epigastric pain Reviewed ultrasound results with patient. Essentially normal. Refer to GI for further evaluation.  - Ambulatory referral to Gastroenterology  3. Bilious vomiting with nausea Reviewed ultrasound results with patient. Essentially normal. Refer to GI for further evaluation.  - Ambulatory referral to Gastroenterology  4. Vitamin D deficiency Treat with drisdol weekly for next few months and then take vitamin D OTC 2000 iu daily.  - ergocalciferol (DRISDOL) 1.25 MG (50000 UT) capsule; Take 1 capsule  (50,000 Units total) by mouth once a week.  Dispense: 4 capsule; Refill: 5  5. Dense breast tissue Will get diagnostic mammogram, bilateral for baseline screening. She dies have strong family history of breast cancer.  - MM Digital Diagnostic Bilat; Future  6. Family history of breast cancer Will get diagnostic mammogram, bilateral for baseline screening. She dies have strong family history of breast cancer.      Immunization History  Administered Date(s) Administered   DTP 12/27/1989   Hepatitis B, adult 03/10/1997, 04/07/1997, 09/15/1997   OPV 12/27/1989   Tdap 10/23/2017    Health Maintenance  Topic Date Due   COVID-19 Vaccine (1) Never done   Hepatitis C Screening  Never done   PAP SMEAR-Modifier  01/23/2011   INFLUENZA VACCINE  12/21/2021   TETANUS/TDAP  10/24/2027   HIV Screening  Completed   HPV VACCINES  Aged Out    Discussed health benefits of physical activity, and encouraged her to engage in regular exercise appropriate for her age and condition.  Problem List Items Addressed This Visit       Digestive   Bilious vomiting with nausea   Relevant Orders   Ambulatory referral to Gastroenterology     Other   Epigastric pain   Relevant Orders   Ambulatory referral to Gastroenterology   Vitamin D deficiency   Relevant Medications   ergocalciferol (DRISDOL) 1.25 MG (50000 UT) capsule   Family history of breast cancer   Dense breast tissue   Relevant Orders   MM Digital Diagnostic Bilat   Other Visit Diagnoses     Encounter for general adult medical examination with  abnormal findings    -  Primary        Return in about 1 year (around 12/25/2022) for health maintenance exam, FBW a week prior to visit.        Ronnell Freshwater, NP  [facility] [phone] (phone) [phone] (fax)  [place]

## 2021-12-24 ENCOUNTER — Ambulatory Visit (INDEPENDENT_AMBULATORY_CARE_PROVIDER_SITE_OTHER): Payer: 59 | Admitting: Nurse Practitioner

## 2021-12-24 ENCOUNTER — Encounter: Payer: Self-pay | Admitting: Nurse Practitioner

## 2021-12-24 VITALS — BP 134/86 | HR 81 | Temp 97.9°F | Ht 63.0 in | Wt 246.0 lb

## 2021-12-24 DIAGNOSIS — R1114 Bilious vomiting: Secondary | ICD-10-CM | POA: Diagnosis not present

## 2021-12-24 DIAGNOSIS — R1013 Epigastric pain: Secondary | ICD-10-CM | POA: Diagnosis not present

## 2021-12-24 DIAGNOSIS — R922 Inconclusive mammogram: Secondary | ICD-10-CM

## 2021-12-24 DIAGNOSIS — Z0001 Encounter for general adult medical examination with abnormal findings: Secondary | ICD-10-CM | POA: Diagnosis not present

## 2021-12-24 DIAGNOSIS — E559 Vitamin D deficiency, unspecified: Secondary | ICD-10-CM | POA: Diagnosis not present

## 2021-12-24 DIAGNOSIS — Z803 Family history of malignant neoplasm of breast: Secondary | ICD-10-CM

## 2021-12-24 MED ORDER — ERGOCALCIFEROL 1.25 MG (50000 UT) PO CAPS: 50000.0000 [IU] | ORAL_CAPSULE | ORAL | 5 refills | Status: DC

## 2022-01-02 DIAGNOSIS — R9230 Dense breasts, unspecified: Secondary | ICD-10-CM | POA: Insufficient documentation

## 2022-01-02 DIAGNOSIS — R922 Inconclusive mammogram: Secondary | ICD-10-CM | POA: Insufficient documentation

## 2022-01-28 ENCOUNTER — Ambulatory Visit
Admission: RE | Admit: 2022-01-28 | Discharge: 2022-01-28 | Disposition: A | Discharge status: Home / Self Care | Payer: 59 | Source: Ambulatory Visit | Attending: Nurse Practitioner | Admitting: Nurse Practitioner

## 2022-01-28 ENCOUNTER — Other Ambulatory Visit: Payer: Self-pay | Admitting: Nurse Practitioner

## 2022-01-28 DIAGNOSIS — N6320 Unspecified lump in the left breast, unspecified quadrant: Secondary | ICD-10-CM

## 2022-01-28 DIAGNOSIS — N6310 Unspecified lump in the right breast, unspecified quadrant: Secondary | ICD-10-CM

## 2022-01-30 NOTE — Progress Notes (Signed)
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 Repeat imaging in 6 months. Results discussed with patient and repeat imaging also scheduled.

## 2022-01-30 NOTE — Progress Notes (Signed)
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 Repeat imaging in 6 months. Results discussed with patient and repeat imaging also scheduled.

## 2022-02-18 ENCOUNTER — Encounter: Payer: Self-pay | Admitting: Physician Assistant

## 2022-03-04 ENCOUNTER — Ambulatory Visit (LOCAL_COMMUNITY_HEALTH_CENTER): Payer: 59 | Admitting: Nurse Practitioner

## 2022-03-04 ENCOUNTER — Encounter: Payer: Self-pay | Admitting: Nurse Practitioner

## 2022-03-04 VITALS — BP 129/78 | Ht 63.0 in | Wt 245.0 lb

## 2022-03-04 DIAGNOSIS — Z3009 Encounter for other general counseling and advice on contraception: Secondary | ICD-10-CM

## 2022-03-04 DIAGNOSIS — Z01419 Encounter for gynecological examination (general) (routine) without abnormal findings: Secondary | ICD-10-CM

## 2022-03-04 DIAGNOSIS — Z3046 Encounter for surveillance of implantable subdermal contraceptive: Secondary | ICD-10-CM

## 2022-03-04 NOTE — Progress Notes (Signed)
Pt appointment for initial visit, pap, and Nexplanon removal. Tobacco cessation counseling provided by RN. Seen by FNP White. Nexplanon removed. Pt reports planning to have her PCP prescribe her oral contraceptives, but in the meantime plans to use condoms. Condom consent signed. Family planning packet given and contents reviewed.

## 2022-03-04 NOTE — Progress Notes (Signed)
Nexplanon Removal Patient identified, informed consent performed, consent signed.   Appropriate time out taken. Nexplanon site identified.  Area prepped in usual sterile fashon. 3 ml of 1% lidocaine with Epinephrine was used to anesthetize the area at the distal end of the implant and along implant site. A small stab incision was made right beside the implant on the distal portion.  The Nexplanon rod was grasped manual and removed without difficulty.  While removing device noticed that the Nexplanon was broken into two pieces.  3 ml of 1% lidocaine with Epinephrine was used distal from the primary removal site.  There was minimal blood loss. Steri-strips were applied over the small incisions.  A pressure bandage was applied to reduce any bruising.  The patient tolerated the procedure well and was given post procedure instructions.   Nexplanon was measured to ensure all pieces were removed appropriately.  Gregary Cromer, FNP

## 2022-03-04 NOTE — Progress Notes (Signed)
[facility] [place] Number: [phone]    Family Planning Visit- Initial Visit  Subjective:  Michelle Fitzgerald is a 36 y.o.  [id number]   being seen today for an initial annual visit and to discuss reproductive life planning.  The patient is currently using Hormonal Implant for pregnancy prevention. Patient reports   does not want a pregnancy in the next year.     report they are looking for a method that provides Other patient desires for PCP to prescribe birth control.  Patient has the following medical conditions has Epigastric pain; Bilious vomiting with nausea; Abnormal weight gain; Other fatigue; Vitamin D deficiency; BMI 40.0-44.9, adult ([place]); Family history of breast cancer; and Dense breast tissue on their problem list.  Chief Complaint  Patient presents with   Contraception    Physical and nexplanon removal     Patient reports to clinic today for a physical and Nexplanon removal.    Body mass index is 43.4 kg/m. - Patient is eligible for diabetes screening based on BMI and age >35?  yes HA1C ordered? No, patient refused   Patient reports 3  partner/s in last year. Desires STI screening?  No - refused  Has patient been screened once for HCV in the past?  No  No results found for: "HCVAB"  Does the patient have current drug use (including MJ), have a partner with drug use, and/or has been incarcerated since last result? No  If yes-- Screen for HCV through [facility]   Does the patient meet criteria for HBV testing? Yes  Criteria:  -Household, sexual or needle sharing contact with HBV -History of drug use -HIV positive -Those with known Hep C   Health Maintenance Due  Topic Date Due   COVID-19 Vaccine (1) Never done   Hepatitis C Screening  Never done   PAP SMEAR-Modifier  01/23/2011   INFLUENZA VACCINE  Never done    Review of Systems  Constitutional:  Negative for chills, fever,  malaise/fatigue and weight loss.  HENT:  Negative for congestion, hearing loss and sore throat.   Eyes:  Negative for blurred vision, double vision and photophobia.  Respiratory:  Negative for shortness of breath.   Cardiovascular:  Negative for chest pain.  Gastrointestinal:  Negative for abdominal pain, blood in stool, constipation, diarrhea, heartburn, nausea and vomiting.  Genitourinary:  Negative for dysuria and frequency.  Musculoskeletal:  Negative for back pain, joint pain and neck pain.  Skin:  Negative for itching and rash.  Neurological:  Positive for headaches. Negative for dizziness and weakness.  Endo/Heme/Allergies:  Does not bruise/bleed easily.  Psychiatric/Behavioral:  Negative for depression, substance abuse and suicidal ideas.     The following portions of the patient's history were reviewed and updated as appropriate: allergies, current medications, past family history, past medical history, past social history, past surgical history and problem list. Problem list updated.   See flowsheet for other program required questions.  Objective:   Vitals:   03/04/22 0957  BP: 129/78  Weight: 245 lb (111.1 kg)  Height: 5\' 3"  (1.6 m)    Physical Exam Constitutional:      Appearance: Normal appearance.  HENT:     Head: Normocephalic. No abrasion, masses or laceration. Hair is normal.     Jaw: No tenderness or swelling.     Right Ear: External ear normal.     Left Ear: External ear normal.     Nose:  Nose normal.     Mouth/Throat:     Lips: Pink. No lesions.     Mouth: Mucous membranes are moist. No lacerations or oral lesions.     Dentition: No dental caries.     Tongue: No lesions.     Palate: No mass and lesions.     Pharynx: No pharyngeal swelling, oropharyngeal exudate, posterior oropharyngeal erythema or uvula swelling.     Tonsils: No tonsillar exudate or tonsillar abscesses.     Comments: No visible signs of dental caries  Eyes:     Pupils: Pupils are  equal, round, and reactive to light.  Neck:     Thyroid: No thyroid mass, thyromegaly or thyroid tenderness.  Cardiovascular:     Rate and Rhythm: Normal rate and regular rhythm.  Pulmonary:     Effort: Pulmonary effort is normal.     Breath sounds: Normal breath sounds.  Chest:  Breasts:    Right: Normal. No swelling, mass, nipple discharge, skin change or tenderness.     Left: Mass present. No swelling, nipple discharge, skin change or tenderness.     Comments: 2-3 cm movable, non-tender nodule noted at 12 o'clock to left breast Abdominal:     General: Abdomen is flat. Bowel sounds are normal.     Palpations: Abdomen is soft.     Tenderness: There is no abdominal tenderness. There is no rebound.  Genitourinary:    Pubic Area: No rash or pubic lice.      Labia:        Right: No rash, tenderness or lesion.        Left: No rash, tenderness or lesion.      Vagina: Normal. No vaginal discharge, erythema, tenderness or lesions.     Cervix: No cervical motion tenderness, discharge, lesion or erythema.     Uterus: Normal.      Adnexa:        Right: No tenderness.         Left: No tenderness.       Rectum: Normal.     Comments: Amount Discharge: small  Odor: No pH: less than 4.5 Adheres to vaginal wall: No Color: color of discharge matches the Michelle Fitzgerald swab  Musculoskeletal:     Cervical back: Full passive range of motion without pain and normal range of motion.  Lymphadenopathy:     Cervical: No cervical adenopathy.     Right cervical: No superficial, deep or posterior cervical adenopathy.    Left cervical: No superficial, deep or posterior cervical adenopathy.     Upper Body:     Right upper body: No supraclavicular, axillary or epitrochlear adenopathy.     Left upper body: No supraclavicular, axillary or epitrochlear adenopathy.     Lower Body: No right inguinal adenopathy. No left inguinal adenopathy.  Skin:    General: Skin is warm and dry.     Findings: No erythema,  laceration, lesion or rash.  Neurological:     Mental Status: She is alert and oriented to person, place, and time.  Psychiatric:        Attention and Perception: Attention normal.        Mood and Affect: Mood normal.        Speech: Speech normal.        Behavior: Behavior normal. Behavior is cooperative.       Assessment and Plan:  Michelle Fitzgerald is a 36 y.o. female presenting [facility] Department for an initial annual  wellness/contraceptive visit  Contraception counseling: Reviewed options based on patient desire and reproductive life plan. Patient is interested in No Method - Other Reason.   Risks, benefits, and typical effectiveness rates were reviewed.  Questions were answered.  Written information was also given to the patient to review.    The patient will follow up in  1 years for surveillance.  The patient was told to call with any further questions, or with any concerns about this method of contraception.  Emphasized use of condoms 100% of the time for STI prevention.  Need for ECP was assessed. ECP not offered due to continued use of birth control.  1. Family planning counseling -36 year old female in clinic today for a physical and Nexplanon removal.  -ROS reviewed, patient reports headaches.  Denies signs and symptoms of migraines.  Advised to increase hydration, take medication to manage headaches, and follow up with PCP if symptoms worsen. -STD screening offered, patient declined. -HgbA1C offered per guidelines, patient declined.  -Patient desires to have Nexplanon removed, PCP to manage birth control.    2. Well woman exam with routine gynecological exam -Normal well woman exam. -PAP today. -CBE today, next due 02/2025.  2-3 cm movable, non-tender nodule noted at 12 o'clock to left breast.  Patient reports recently having follow up.   Advised to continue to monitor and have follow-ups.    - IGP, Aptima HPV  3. Nexplanon removal -See  note   Total time spent: 40 minutes   Return in about 1 year (around 03/05/2023) for Annual well-woman exam.  Future Appointments  Date Time Provider [place]  03/25/2022 10:30 AM Alfredia Ferguson, PA-C [facility]  08/05/2022 10:30 AM [facility] [place] 1 [facility]  12/30/2022  8:30 AM Boscia, Greer Ee, NP [facility] None    Gregary Cromer, FNP

## 2022-03-10 LAB — IGP, APTIMA HPV
HPV Aptima: POSITIVE — AB
PAP Smear Comment: 0

## 2022-03-25 ENCOUNTER — Ambulatory Visit: Payer: 59 | Admitting: Physician Assistant

## 2022-05-06 ENCOUNTER — Ambulatory Visit: Payer: 59 | Admitting: Nurse Practitioner

## 2022-05-30 ENCOUNTER — Ambulatory Visit (INDEPENDENT_AMBULATORY_CARE_PROVIDER_SITE_OTHER): Payer: 59 | Admitting: Nurse Practitioner

## 2022-05-30 ENCOUNTER — Encounter: Payer: Self-pay | Admitting: Nurse Practitioner

## 2022-05-30 VITALS — BP 125/87 | HR 78 | Ht 63.0 in | Wt 249.1 lb

## 2022-05-30 DIAGNOSIS — B370 Candidal stomatitis: Secondary | ICD-10-CM | POA: Insufficient documentation

## 2022-05-30 DIAGNOSIS — B379 Candidiasis, unspecified: Secondary | ICD-10-CM

## 2022-05-30 DIAGNOSIS — T3695XA Adverse effect of unspecified systemic antibiotic, initial encounter: Secondary | ICD-10-CM | POA: Diagnosis not present

## 2022-05-30 MED ORDER — NYSTATIN 100000 UNIT/ML MT SUSP: OROMUCOSAL | 1 refills | Status: DC

## 2022-05-30 MED ORDER — FLUCONAZOLE 150 MG PO TABS: ORAL_TABLET | ORAL | 0 refills | Status: DC

## 2022-05-30 NOTE — Progress Notes (Signed)
New Patient Office Visit  Subjective    Patient ID: Michelle Fitzgerald, female    DOB: 05-04-1986  Age: 37 y.o. MRN: [id number]  CC:  Chief Complaint  Patient presents with   tongue    HPI Michelle Fitzgerald presents to establish care Went to urgent Care in [place]. Was treated for URI/bronchitis.  -was started on augmenti, prednisone, and rescue inhaler.  -after first few days, she started having burning of the tongue  -food and drinks don't test right.  -has developed white bumps on the tongue as well.  -she states that these symptoms have been going on for 3 weeks to a month  -she denies cough or sore throat.  -denies difficulty swallowing.   Outpatient Encounter Medications as of 05/30/2022  Medication Sig   ergocalciferol (DRISDOL) 1.25 MG (50000 UT) capsule Take 1 capsule (50,000 Units total) by mouth once a week.   fluconazole (DIFLUCAN) 150 MG tablet Take 1 tablet po once. May repeat dose in 3 days as needed for persistent symptoms.   nystatin (MYCOSTATIN) 100000 UNIT/ML suspension Swish and spit with 5 mls QID for 7 to 10 days   [DISCONTINUED] etonogestrel (NEXPLANON) 68 MG IMPL implant Inject into the skin.   No facility-administered encounter medications on file as of 05/30/2022.    Past Medical History:  Diagnosis Date   Migraines     Past Surgical History:  Procedure Laterality Date   DIAGNOSTIC LAPAROSCOPY WITH REMOVAL OF ECTOPIC PREGNANCY N/A 01/01/2019   Procedure: DIAGNOSTIC LAPAROSCOPY WITH REMOVAL OF ECTOPIC PREGNANCY;  Surgeon: Linzie Collin, MD;  Location: [facility] ORS;  Service: Gynecology;  Laterality: N/A;   FINGER TENDON REPAIR Right    right middle finger   LUMBAR LAMINECTOMY/DECOMPRESSION MICRODISCECTOMY N/A 06/14/2017   Procedure: LUMBAR LAMINECTOMY/DECOMPRESSION MICRODISCECTOMY 2 LEVELS-L4-5,L5-S1;  Surgeon: Venetia Night, MD;  Location: [facility] ORS;  Service: Neurosurgery;  Laterality: N/A;   TONSILLECTOMY      Family History  Problem  Relation Age of Onset   Diabetes Father    Migraines Brother    Deep vein thrombosis Brother    Breast cancer Maternal Grandmother        8s   Diabetes Maternal Grandmother    Hypertension Maternal Grandmother    Stroke Maternal Grandmother    Kidney disease Maternal Grandmother    Hypertension Maternal Grandfather    Stroke Maternal Grandfather    Heart disease Maternal Grandfather     Social History   Socioeconomic History   Marital status: Single    Spouse name: Not on file   Number of children: Not on file   Years of education: Not on file   Highest education level: Not on file  Occupational History   Not on file  Tobacco Use   Smoking status: Every Day    Packs/day: 1.00    Years: 20.00    Total pack years: 20.00    Types: Cigarettes   Smokeless tobacco: Never  Vaping Use   Vaping Use: Former   Quit date: 03/06/2019   Substances: Nicotine, Flavoring  Substance and Sexual Activity   Alcohol use: No   Drug use: Not Currently    Types: Marijuana   Sexual activity: Yes    Partners: Male    Birth control/protection: Condom, Implant  Other Topics Concern   Not on file  Social History Narrative   Not on file   Social Determinants of Health   Financial Resource Strain: Not on file  Food Insecurity: Not on file  Transportation Needs: Not on file  Physical Activity: Not on file  Stress: Not on file  Social Connections: Not on file  Intimate Partner Violence: Not At Risk (03/04/2022)   Humiliation, Afraid, Rape, and Kick questionnaire    Fear of Current or Ex-Partner: No    Emotionally Abused: No    Physically Abused: No    Sexually Abused: No    Review of Systems  Constitutional:  Negative for chills, fever and malaise/fatigue.  HENT:  Negative for congestion, sinus pain and sore throat.        Sore tongue with patches of bumps. Has some redness on roof of her mouth.   Eyes: Negative.   Respiratory:  Negative for cough, shortness of breath and  wheezing.   Cardiovascular:  Negative for chest pain, palpitations and leg swelling.  Gastrointestinal:  Negative for constipation, diarrhea, nausea and vomiting.  Genitourinary: Negative.   Musculoskeletal:  Negative for myalgias.  Skin: Negative.   Neurological:  Negative for dizziness and headaches.  Endo/Heme/Allergies:  Does not bruise/bleed easily.  Psychiatric/Behavioral:  Negative for depression. The patient is not nervous/anxious.         Objective    Today's Vitals   05/30/22 0952  BP: 125/87  Pulse: 78  SpO2: 97%  Weight: 249 lb 1.9 oz (113 kg)  Height: 5\' 3"  (1.6 m)   Body mass index is 44.13 kg/m.   Physical Exam Vitals and nursing note reviewed.  Constitutional:      Appearance: Normal appearance. She is well-developed.  HENT:     Head: Normocephalic and atraumatic.     Mouth/Throat:     Lips: Pink.     Mouth: Mucous membranes are moist.     Pharynx: Oropharynx is clear. Uvula midline.     Comments: Geographic tongue. Some white patches noted. Redness noted on the roof of the mouth.  Eyes:     Extraocular Movements: Extraocular movements intact.     Conjunctiva/sclera: Conjunctivae normal.     Pupils: Pupils are equal, round, and reactive to light.  Cardiovascular:     Rate and Rhythm: Normal rate and regular rhythm.     Pulses: Normal pulses.     Heart sounds: Normal heart sounds.  Pulmonary:     Effort: Pulmonary effort is normal.     Breath sounds: Normal breath sounds.  Abdominal:     Palpations: Abdomen is soft.  Musculoskeletal:        General: Normal range of motion.     Cervical back: Normal range of motion and neck supple.  Lymphadenopathy:     Cervical: No cervical adenopathy.  Skin:    General: Skin is warm and dry.     Capillary Refill: Capillary refill takes less than 2 seconds.  Neurological:     General: No focal deficit present.     Mental Status: She is alert and oriented to person, place, and time.  Psychiatric:         Mood and Affect: Mood normal.        Behavior: Behavior normal.        Thought Content: Thought content normal.        Judgment: Judgment normal.       Assessment & Plan:  1. Oral thrush Start diflucan 150 mg. Take once. Repeat in three days for persistent symptoms. Swish and spit with 5 mls up to 4 times daily for next week to 10 days.  - fluconazole (DIFLUCAN) 150 MG tablet; Take 1 tablet  po once. May repeat dose in 3 days as needed for persistent symptoms.  Dispense: 3 tablet; Refill: 0 - nystatin (MYCOSTATIN) 100000 UNIT/ML suspension; Swish and spit with 5 mls QID for 7 to 10 days  Dispense: 200 mL; Refill: 1  2. Antibiotic-induced yeast infection Start diflucan 150 mg. Take once. Repeat in three days for persistent symptoms. Swish and spit with 5 mls up to 4 times daily for next week to 10 days.  - fluconazole (DIFLUCAN) 150 MG tablet; Take 1 tablet po once. May repeat dose in 3 days as needed for persistent symptoms.  Dispense: 3 tablet; Refill: 0    Return for prn worsening or persistent symptoms - she wants to make routine appt. to discuss OCP .   Carlean Jews, NP

## 2022-06-17 ENCOUNTER — Ambulatory Visit: Payer: 59 | Admitting: Nurse Practitioner

## 2022-06-17 NOTE — Progress Notes (Deleted)
Assessment    Patient profile:  Michelle Fitzgerald is a 37 y.o. year old female , new to the practice with a past medical history of vitamin D deficiency, migraines, ectopic pregnancy.  See PMH / [place] for additional history  Patient referred by PCP for evaluation of epigastric pain and vomiting    Plan:        HPI:    Chief Complaint:   Review of records in epic show that she was evaluated in July 2023 by her PCP for RUQ pain.  Abdominal ultrasound was unremarkable.  Lipase, LFTs and CBC  were normal.    Interval history:   Previous Labs / Imaging::    Latest Ref Rng & Units 11/25/2021    9:32 AM 01/01/2019    4:49 PM 06/07/2017   11:33 AM  CBC  WBC 3.4 - 10.8 x10E3/uL 8.2  15.6  10.0   Hemoglobin 11.1 - 15.9 g/dL 14.3  12.5  15.0   Hematocrit 34.0 - 46.6 % 42.5  37.9  44.1   Platelets 150 - 450 x10E3/uL 315  380  317     Lab Results  Component Value Date   LIPASE 23 11/25/2021      Latest Ref Rng & Units 11/25/2021    9:32 AM 01/01/2019    4:49 PM 06/07/2017   11:33 AM  CMP  Glucose 70 - 99 mg/dL 98  103  97   BUN 6 - 20 mg/dL '14  12  23   '$ Creatinine 0.57 - 1.00 mg/dL 0.75  0.79  0.72   Sodium 134 - 144 mmol/L 140  137  138   Potassium 3.5 - 5.2 mmol/L 4.6  4.0  3.9   Chloride 96 - 106 mmol/L 106  105  109   CO2 20 - 29 mmol/L '20  23  21   '$ Calcium 8.7 - 10.2 mg/dL 9.2  9.2  9.2   Total Protein 6.0 - 8.5 g/dL 6.4  6.6    Total Bilirubin 0.0 - 1.2 mg/dL 0.4  0.6    Alkaline Phos 44 - 121 IU/L 69  52    AST 0 - 40 IU/L 18  24    ALT 0 - 32 IU/L 28  38       Abdominal ultrasound 12/03/2021 -Unremarkable   Previous GI Evaluation     I   Past Medical History:  Diagnosis Date   Migraines    Past Surgical History:  Procedure Laterality Date   DIAGNOSTIC LAPAROSCOPY WITH REMOVAL OF ECTOPIC PREGNANCY N/A 01/01/2019   Procedure: DIAGNOSTIC LAPAROSCOPY WITH REMOVAL OF ECTOPIC PREGNANCY;  Surgeon: Harlin Heys, MD;  Location: [facility] ORS;  Service:  Gynecology;  Laterality: N/A;   FINGER TENDON REPAIR Right    right middle finger   LUMBAR LAMINECTOMY/DECOMPRESSION MICRODISCECTOMY N/A 06/14/2017   Procedure: LUMBAR LAMINECTOMY/DECOMPRESSION MICRODISCECTOMY 2 LEVELS-L4-5,L5-S1;  Surgeon: Meade Maw, MD;  Location: [facility] ORS;  Service: Neurosurgery;  Laterality: N/A;   TONSILLECTOMY     Family History  Problem Relation Age of Onset   Diabetes Father    Migraines Brother    Deep vein thrombosis Brother    Breast cancer Maternal Grandmother        25s   Diabetes Maternal Grandmother    Hypertension Maternal Grandmother    Stroke Maternal Grandmother    Kidney disease Maternal Grandmother    Hypertension Maternal Grandfather    Stroke Maternal Grandfather    Heart disease Maternal Grandfather  Social History   Tobacco Use   Smoking status: Every Day    Packs/day: 1.00    Years: 20.00    Total pack years: 20.00    Types: Cigarettes   Smokeless tobacco: Never  Vaping Use   Vaping Use: Former   Quit date: 03/06/2019   Substances: Nicotine, Flavoring  Substance Use Topics   Alcohol use: No   Drug use: Not Currently    Types: Marijuana   Current Outpatient Medications  Medication Sig Dispense Refill   ergocalciferol (DRISDOL) 1.25 MG (50000 UT) capsule Take 1 capsule (50,000 Units total) by mouth once a week. 4 capsule 5   fluconazole (DIFLUCAN) 150 MG tablet Take 1 tablet po once. May repeat dose in 3 days as needed for persistent symptoms. 3 tablet 0   nystatin (MYCOSTATIN) 100000 UNIT/ML suspension Swish and spit with 5 mls QID for 7 to 10 days 200 mL 1   No current facility-administered medications for this visit.   No Known Allergies   Review of Systems: Positive for ***.  All other systems reviewed and negative except where noted in HPI.   Wt Readings from Last 3 Encounters:  05/30/22 249 lb 1.9 oz (113 kg)  03/04/22 245 lb (111.1 kg)  12/24/21 246 lb (111.6 kg)    Physical Exam   LMP 05/23/2022   Constitutional:  Generally well appearing ***female in no acute distress. Psychiatric: Pleasant. Normal mood and affect. Behavior is normal. EENT: Pupils normal.  Conjunctivae are normal. No scleral icterus. Neck supple.  Cardiovascular: Normal rate, regular rhythm.  Pulmonary/chest: Effort normal and breath sounds normal. No wheezing, rales or rhonchi. Abdominal: Soft, nondistended, nontender. Bowel sounds active throughout. There are no masses palpable. No hepatomegaly. Neurological: Alert and oriented to person place and time. Extremities: No edema Skin: Skin is warm and dry. No rashes noted.  Tye Savoy, NP  06/17/2022, 8:44 AM  Cc:  Referring Provider Ronnell Freshwater, NP

## 2022-07-07 ENCOUNTER — Ambulatory Visit (INDEPENDENT_AMBULATORY_CARE_PROVIDER_SITE_OTHER): Payer: 59 | Admitting: Nurse Practitioner

## 2022-07-07 ENCOUNTER — Encounter: Payer: Self-pay | Admitting: Nurse Practitioner

## 2022-07-07 VITALS — BP 139/85 | HR 61 | Resp 18 | Ht 63.0 in | Wt 251.0 lb

## 2022-07-07 DIAGNOSIS — Z30011 Encounter for initial prescription of contraceptive pills: Secondary | ICD-10-CM

## 2022-07-07 DIAGNOSIS — G4719 Other hypersomnia: Secondary | ICD-10-CM | POA: Insufficient documentation

## 2022-07-07 DIAGNOSIS — K219 Gastro-esophageal reflux disease without esophagitis: Secondary | ICD-10-CM

## 2022-07-07 DIAGNOSIS — F17200 Nicotine dependence, unspecified, uncomplicated: Secondary | ICD-10-CM | POA: Insufficient documentation

## 2022-07-07 DIAGNOSIS — R140 Abdominal distension (gaseous): Secondary | ICD-10-CM | POA: Insufficient documentation

## 2022-07-07 DIAGNOSIS — R0683 Snoring: Secondary | ICD-10-CM | POA: Insufficient documentation

## 2022-07-07 MED ORDER — NORETHINDRONE 0.35 MG PO TABS: 1.0000 | ORAL_TABLET | Freq: Every day | ORAL | 11 refills | Status: DC

## 2022-07-07 MED ORDER — PANTOPRAZOLE SODIUM 40 MG PO TBEC: 40.0000 mg | DELAYED_RELEASE_TABLET | Freq: Every day | ORAL | 3 refills | Status: DC

## 2022-07-07 NOTE — Progress Notes (Signed)
Established patient visit   Patient: Michelle Fitzgerald   DOB: 07/23/1985   37 y.o. Female  MRN: [id number] Visit Date: 07/07/2022   Chief Complaint  Patient presents with   Contraception   Gastroesophageal Reflux        Subjective    Gastroesophageal Reflux She complains of abdominal pain and nausea. She reports no chest pain, no coughing, no sore throat or no wheezing. Associated symptoms include fatigue.   HPI     Gastroesophageal Reflux    Additional comments:        Last edited by Gemma Payor, CMA on 07/07/2022  9:24 AM.      Follow up  -recently had Nexplanon removed.  -would like to start on birth control pill.  -using condoms for birth control  -she is a cigarette smoker.   Increased GERD -woke up the other night. Felt like she swallowed a lit cigarette.  -she states that when she tried to inhale, she gasped for breath -constantly feels like bubbles in her upper abdomen and esophagus.  -has to burp them out.  -has tried pirlosec OTC which worked for some time and now does not work  -tried [employer] which did not help  -ultrasound done 11/2021 was normal.   Excess daytime sleepiness -having trouble staying awake despite sleeping for 8 hours or more.  -she often feels like she is going to fall asleep while driving.  -states that she will doze off in the car if waiting in line or at long stop light.  -she thinks she might snore loudly.    Medications: Outpatient Medications Prior to Visit  Medication Sig   ergocalciferol (DRISDOL) 1.25 MG (50000 UT) capsule Take 1 capsule (50,000 Units total) by mouth once a week.   fluconazole (DIFLUCAN) 150 MG tablet Take 1 tablet po once. May repeat dose in 3 days as needed for persistent symptoms.   nystatin (MYCOSTATIN) 100000 UNIT/ML suspension Swish and spit with 5 mls QID for 7 to 10 days   No facility-administered medications prior to visit.    Review of Systems  Constitutional:  Positive for fatigue.  Negative for activity change, appetite change, chills and fever.  HENT:  Negative for congestion, postnasal drip, rhinorrhea, sinus pressure, sinus pain, sneezing and sore throat.   Eyes: Negative.   Respiratory:  Negative for cough, chest tightness, shortness of breath and wheezing.   Cardiovascular:  Negative for chest pain and palpitations.  Gastrointestinal:  Positive for abdominal distention, abdominal pain and nausea. Negative for constipation, diarrhea and vomiting.       Excess belching. Feels lots of gas. Lots of burning in her throat.   Endocrine: Negative for cold intolerance, heat intolerance, polydipsia and polyuria.  Genitourinary:  Negative for dyspareunia, dysuria, flank pain, frequency and urgency.       Patient wants to start on oral contraceptives   Musculoskeletal:  Negative for arthralgias, back pain and myalgias.  Skin:  Negative for rash.  Allergic/Immunologic: Negative for environmental allergies.  Neurological:  Negative for dizziness, weakness and headaches.  Hematological:  Negative for adenopathy.  Psychiatric/Behavioral:  The patient is not nervous/anxious.     Last CBC Lab Results  Component Value Date   WBC 8.2 11/25/2021   HGB 14.3 11/25/2021   HCT 42.5 11/25/2021   MCV 86 11/25/2021   MCH 28.8 11/25/2021   RDW 11.9 11/25/2021   PLT 315 99991111   Last metabolic panel Lab Results  Component Value Date   GLUCOSE 98  11/25/2021   NA 140 11/25/2021   K 4.6 11/25/2021   CL 106 11/25/2021   CO2 20 11/25/2021   BUN 14 11/25/2021   CREATININE 0.75 11/25/2021   EGFR 106 11/25/2021   CALCIUM 9.2 11/25/2021   PROT 6.4 11/25/2021   ALBUMIN 4.3 11/25/2021   LABGLOB 2.1 11/25/2021   AGRATIO 2.0 11/25/2021   BILITOT 0.4 11/25/2021   ALKPHOS 69 11/25/2021   AST 18 11/25/2021   ALT 28 11/25/2021   ANIONGAP 9 01/01/2019   Last lipids Lab Results  Component Value Date   CHOL 202 (H) 11/25/2021   HDL 44 11/25/2021   LDLCALC 145 (H) 11/25/2021    TRIG 74 11/25/2021   CHOLHDL 4.6 (H) 11/25/2021   Last hemoglobin A1c Lab Results  Component Value Date   HGBA1C 5.3 11/25/2021   Last thyroid functions Lab Results  Component Value Date   TSH 1.620 11/25/2021   Last vitamin D Lab Results  Component Value Date   VD25OH 17.5 (L) 11/25/2021       Objective     Today's Vitals   07/07/22 0924  BP: 139/85  Pulse: 61  Resp: 18  SpO2: 97%  Weight: 251 lb (113.9 kg)  Height: 5' 3"$  (1.6 m)   Body mass index is 44.46 kg/m.  BP Readings from Last 3 Encounters:  07/07/22 139/85  05/30/22 125/87  03/04/22 129/78    Wt Readings from Last 3 Encounters:  07/07/22 251 lb (113.9 kg)  05/30/22 249 lb 1.9 oz (113 kg)  03/04/22 245 lb (111.1 kg)    Physical Exam Vitals and nursing note reviewed.  Constitutional:      Appearance: Normal appearance. She is well-developed.  HENT:     Head: Normocephalic and atraumatic.  Eyes:     Pupils: Pupils are equal, round, and reactive to light.  Cardiovascular:     Rate and Rhythm: Normal rate and regular rhythm.     Pulses: Normal pulses.     Heart sounds: Normal heart sounds.  Pulmonary:     Effort: Pulmonary effort is normal.     Breath sounds: Normal breath sounds.  Abdominal:     Palpations: Abdomen is soft.  Musculoskeletal:        General: Normal range of motion.     Cervical back: Normal range of motion and neck supple.  Lymphadenopathy:     Cervical: No cervical adenopathy.  Skin:    General: Skin is warm and dry.     Capillary Refill: Capillary refill takes less than 2 seconds.  Neurological:     General: No focal deficit present.     Mental Status: She is alert and oriented to person, place, and time.  Psychiatric:        Mood and Affect: Mood normal.        Behavior: Behavior normal.        Thought Content: Thought content normal.        Judgment: Judgment normal.      Assessment & Plan    1. Gastroesophageal reflux disease without esophagitis Prilosec and  pepcid no longer working. Start protonix 40 mg. Encouraged her to keep a food diary to try and eliminate trigger foods. Consider referral to GI if ineffective.  - pantoprazole (PROTONIX) 40 MG tablet; Take 1 tablet (40 mg total) by mouth daily.  Dispense: 30 tablet; Refill: 3  2. Abdominal bloating Negative ultrasound in 11/2021. Prilosec and pepcid no longer working. Start protonix 40 mg. Encouraged her to  keep a food diary to try and eliminate trigger foods. Consider referral to GI if ineffective.  - pantoprazole (PROTONIX) 40 MG tablet; Take 1 tablet (40 mg total) by mouth daily.  Dispense: 30 tablet; Refill: 3  3. Excessive daytime sleepiness Refer for sleep study  - Ambulatory referral to Sleep Studies  4. Loud snoring Refer for sleep study  - Ambulatory referral to Sleep Studies  5. Encounter for oral contraception initial prescription Start norethindrone 0.35 mg daily. Reassess in 3 months.  - norethindrone (ORTHO MICRONOR) 0.35 MG tablet; Take 1 tablet (0.35 mg total) by mouth daily.  Dispense: 28 tablet; Refill: 11  6. Current smoker Start non-estrogen containing OCP.     Problem List Items Addressed This Visit       Digestive   Gastroesophageal reflux disease without esophagitis - Primary   Relevant Medications   pantoprazole (PROTONIX) 40 MG tablet     Other   Abdominal bloating   Relevant Medications   pantoprazole (PROTONIX) 40 MG tablet   Excessive daytime sleepiness   Relevant Orders   Ambulatory referral to Sleep Studies   Loud snoring   Relevant Orders   Ambulatory referral to Sleep Studies   Current smoker   Other Visit Diagnoses     Encounter for oral contraception initial prescription       Relevant Medications   norethindrone (ORTHO MICRONOR) 0.35 MG tablet        Return in about 3 months (around 10/05/2022) for GERD,.         Ronnell Freshwater, NP  [facility] at [facility] [phone] (phone) [phone] (fax)  [place]

## 2022-08-05 ENCOUNTER — Other Ambulatory Visit: Payer: 59

## 2022-09-15 ENCOUNTER — Ambulatory Visit (INDEPENDENT_AMBULATORY_CARE_PROVIDER_SITE_OTHER): Payer: 59 | Admitting: Nurse Practitioner

## 2022-09-15 ENCOUNTER — Encounter: Payer: Self-pay | Admitting: Nurse Practitioner

## 2022-09-15 VITALS — BP 141/93 | HR 61 | Ht 63.0 in | Wt 259.9 lb

## 2022-09-15 DIAGNOSIS — M25571 Pain in right ankle and joints of right foot: Secondary | ICD-10-CM | POA: Diagnosis not present

## 2022-09-15 DIAGNOSIS — M79671 Pain in right foot: Secondary | ICD-10-CM | POA: Insufficient documentation

## 2022-09-15 NOTE — Assessment & Plan Note (Signed)
Advise patient to rest, ice, and elevate the foot and ankle when possible.  Take tylenol/ibuprofen as needed and as indicated  X-ray the ankle for further evaluation.  Consider referral to podiatry if needed

## 2022-09-15 NOTE — Assessment & Plan Note (Signed)
Advise patient to rest, ice, and elevate the foot when possible.  Take tylenol/ibuprofen as needed and as indicated  X-ray the foot for further evaluation.  Consider referral to podiatry if needed

## 2022-09-15 NOTE — Progress Notes (Signed)
Established patient visit   Patient: Michelle Fitzgerald   DOB: 03/10/1986   37 y.o. Female  MRN: [id number] Visit Date: 09/15/2022  Chief Complaint  Patient presents with   Foot Swelling   Subjective    The patient states that this has been going on for about 3 weeks  The more she is on her feet, the more it hurts.  -mostly along the tope of the foot  -mild swelling noted   Foot Injury  The incident occurred more than 1 week ago. There was no injury mechanism. The pain is present in the left ankle and left foot. The quality of the pain is described as aching. The pain is at a severity of 3/10. She reports no foreign bodies present. The symptoms are aggravated by movement and weight bearing. She has tried NSAIDs and ice for the symptoms. The treatment provided mild relief.     Medications: Outpatient Medications Prior to Visit  Medication Sig   norethindrone (ORTHO MICRONOR) 0.35 MG tablet Take 1 tablet (0.35 mg total) by mouth daily.   [DISCONTINUED] ergocalciferol (DRISDOL) 1.25 MG (50000 UT) capsule Take 1 capsule (50,000 Units total) by mouth once a week.   [DISCONTINUED] fluconazole (DIFLUCAN) 150 MG tablet Take 1 tablet po once. May repeat dose in 3 days as needed for persistent symptoms.   [DISCONTINUED] nystatin (MYCOSTATIN) 100000 UNIT/ML suspension Swish and spit with 5 mls QID for 7 to 10 days   [DISCONTINUED] pantoprazole (PROTONIX) 40 MG tablet Take 1 tablet (40 mg total) by mouth daily.   No facility-administered medications prior to visit.    Review of Systems See HPI      Objective     Today's Vitals   09/15/22 0859 09/15/22 0918  BP: (Abnormal) 142/84 (Abnormal) 141/93  Pulse: 61   SpO2: 98%   Weight: 259 lb 14.4 oz (117.9 kg)   Height:  (1.6 m)    Body mass index is 46.04 kg/m.   Physical Exam Vitals and nursing note reviewed.  Constitutional:      Appearance: Normal appearance. She is well-developed.  HENT:     Head: Normocephalic and  atraumatic.     Nose: Nose normal.     Mouth/Throat:     Mouth: Mucous membranes are moist.     Pharynx: Oropharynx is clear.  Eyes:     Extraocular Movements: Extraocular movements intact.     Conjunctiva/sclera: Conjunctivae normal.     Pupils: Pupils are equal, round, and reactive to light.  Neck:     Vascular: No carotid bruit.  Cardiovascular:     Rate and Rhythm: Normal rate and regular rhythm.     Pulses: Normal pulses.     Heart sounds: Normal heart sounds.  Pulmonary:     Effort: Pulmonary effort is normal.     Breath sounds: Normal breath sounds.  Abdominal:     Palpations: Abdomen is soft.  Musculoskeletal:        General: Normal range of motion.     Cervical back: Normal range of motion and neck supple.       Feet:  Feet:     Comments: Swelling along the  Lymphadenopathy:     Cervical: No cervical adenopathy.  Skin:    General: Skin is warm and dry.     Capillary Refill: Capillary refill takes less than 2 seconds.  Neurological:     General: No focal deficit present.     Mental Status: She is alert and oriented  to person, place, and time.  Psychiatric:        Mood and Affect: Mood normal.        Behavior: Behavior normal.        Thought Content: Thought content normal.        Judgment: Judgment normal.      Assessment & Plan     Right foot pain Assessment & Plan: Advise patient to rest, ice, and elevate the foot when possible.  Take tylenol/ibuprofen as needed and as indicated  X-ray the foot for further evaluation.  Consider referral to podiatry if needed   Orders: -     DG Foot 2 Views Right; Future  Acute right ankle pain Assessment & Plan: Advise patient to rest, ice, and elevate the foot and ankle when possible.  Take tylenol/ibuprofen as needed and as indicated  X-ray the ankle for further evaluation.  Consider referral to podiatry if needed   Orders: -     DG Ankle 2 Views Right; Future     Return for as scheduled.         Carlean Jews, NP  [facility] at [facility] [phone] (phone) [phone] (fax)  [facility]

## 2022-09-16 ENCOUNTER — Ambulatory Visit
Admission: RE | Admit: 2022-09-16 | Discharge: 2022-09-16 | Disposition: A | Discharge status: Home / Self Care | Payer: 59 | Source: Ambulatory Visit | Attending: Nurse Practitioner | Admitting: Nurse Practitioner

## 2022-09-16 DIAGNOSIS — M25571 Pain in right ankle and joints of right foot: Secondary | ICD-10-CM

## 2022-09-16 DIAGNOSIS — M79671 Pain in right foot: Secondary | ICD-10-CM

## 2022-09-19 NOTE — Progress Notes (Signed)
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 There is also a small calcaneal heel spur. If she wants, I can do a referral to podiatry. If so, does she have a particular provider she would like to see, or place she would like to go.  Thanks so much.   -HB

## 2022-09-20 ENCOUNTER — Other Ambulatory Visit: Payer: Self-pay

## 2022-09-20 DIAGNOSIS — M79671 Pain in right foot: Secondary | ICD-10-CM

## 2022-09-20 DIAGNOSIS — M1990 Unspecified osteoarthritis, unspecified site: Secondary | ICD-10-CM

## 2022-09-20 DIAGNOSIS — M25571 Pain in right ankle and joints of right foot: Secondary | ICD-10-CM

## 2022-09-29 ENCOUNTER — Ambulatory Visit: Payer: 59 | Admitting: Podiatry

## 2022-09-30 ENCOUNTER — Ambulatory Visit (INDEPENDENT_AMBULATORY_CARE_PROVIDER_SITE_OTHER): Payer: 59 | Admitting: Podiatry

## 2022-09-30 DIAGNOSIS — M659 Synovitis and tenosynovitis, unspecified: Secondary | ICD-10-CM | POA: Diagnosis not present

## 2022-09-30 DIAGNOSIS — M25571 Pain in right ankle and joints of right foot: Secondary | ICD-10-CM

## 2022-09-30 DIAGNOSIS — Z87828 Personal history of other (healed) physical injury and trauma: Secondary | ICD-10-CM

## 2022-09-30 NOTE — Progress Notes (Signed)
Chief Complaint  Patient presents with   Foot Pain    Pain located in the right foot, midfoot , lateral side, pain has been ongoing for 1 month, no prior treatment, patient rolls ankles a lot at work, patient on feet 8 hrs a day     HPI: 37 y.o. female presenting today with concern of right ankle pain.  She notes that when she made the appointment a couple weeks ago she was having significant pain.  She has not been attempting any self treatment at home but notes that over the past week it has almost resolved.  She wanted to be seen anyway to make sure there is nothing else going on.  She saw her family physician on April 25 for this and had ankle and foot x-rays on September 16, 2022 which were negative for fracture.  She notes that she is a chronic ankle sprainer.  Denies bruising to the area     Past Medical History:  Diagnosis Date   Migraines     Past Surgical History:  Procedure Laterality Date   DIAGNOSTIC LAPAROSCOPY WITH REMOVAL OF ECTOPIC PREGNANCY N/A 01/01/2019   Procedure: DIAGNOSTIC LAPAROSCOPY WITH REMOVAL OF ECTOPIC PREGNANCY;  Surgeon: Linzie Collin, MD;  Location: [facility] ORS;  Service: Gynecology;  Laterality: N/A;   FINGER TENDON REPAIR Right    right middle finger   LUMBAR LAMINECTOMY/DECOMPRESSION MICRODISCECTOMY N/A 06/14/2017   Procedure: LUMBAR LAMINECTOMY/DECOMPRESSION MICRODISCECTOMY 2 LEVELS-L4-5,L5-S1;  Surgeon: Venetia Night, MD;  Location: [facility] ORS;  Service: Neurosurgery;  Laterality: N/A;   TONSILLECTOMY      No Known Allergies  Review of Systems  Musculoskeletal:  Positive for joint pain.       Right anterolateral ankle      Physical Exam:  General: The patient is alert and oriented x3 in no acute distress.  Dermatology: Skin is warm, dry and supple bilateral lower extremities. Interspaces are clear of maceration and debris.    Vascular: Palpable pedal pulses bilaterally. Capillary refill within normal limits.  Mild localized edema  to the anterolateral right ankle.  No erythema or calor.  Neurological: Light touch sensation grossly intact bilateral feet.   Musculoskeletal Exam: There is pain on palpation of the anterolateral aspect of the right ankle.  No pain on palpation to the sinus tarsi.  There is mild pain on palpation to the extensor digitorum brevis muscle.  Supination and eversion of the foot do not reproduce pain.  Plantarflexion and dorsiflexion of the ankle do not reproduce pain.  There is no crepitus on ankle range of motion.  Ankle range of motion is within normal limits.  Radiographic Exam (Right ankle and foot from 09/16/22 external source):  FINDINGS: Right ankle:   The ankle mortise is symmetric and intact. Mild lateral ankle soft tissue swelling. Joint spaces are preserved. No acute fracture or dislocation.   Right foot:   Minimal lateral great toe metatarsophalangeal degenerative osteophytosis. Tiny plantar calcaneal heel spur. Mild distal anterior tibial plafond apparent bone density suggesting degenerative osteophytosis. No acute fracture or dislocation.   IMPRESSION: 1. Mild lateral ankle soft tissue swelling. No acute fracture. 2. Minimal great toe metatarsophalangeal osteoarthritis.     Electronically Signed   By: Neita Garnet M.D.   On: 09/19/2022 11:32  Assessment/Plan of Care: right ankle pain which has been improving over the past week. 2.  Right lateral ankle gutter synovitis.    Discussed clinical findings with the patient today.  Also discussed  her prior radiographic results.  Discussed conservative treatment options with the patient today.  Since she is feeling better she does not want a cortisone injection today.  She was fitted and dispensed an elastic ankle sleeve today.  Discussed use of Voltaren gel twice daily to affected area.  Also discussed possible referral to physical therapy in the future if she feels the ankles are weak and may need to have some focal  restrengthening of the ankle stabilizing muscles.  Follow-up as needed   Clerance Lav, DPM, FACFAS [facility]     [address] [place], [place] [address]                Office [phone]  Fax [phone]

## 2022-10-02 ENCOUNTER — Telehealth: Payer: Self-pay | Admitting: Nurse Practitioner

## 2022-10-02 DIAGNOSIS — R140 Abdominal distension (gaseous): Secondary | ICD-10-CM

## 2022-10-02 DIAGNOSIS — K219 Gastro-esophageal reflux disease without esophagitis: Secondary | ICD-10-CM

## 2022-10-06 ENCOUNTER — Ambulatory Visit: Payer: 59 | Admitting: Nurse Practitioner

## 2022-10-10 ENCOUNTER — Other Ambulatory Visit: Payer: Self-pay | Admitting: Nurse Practitioner

## 2022-10-10 DIAGNOSIS — K219 Gastro-esophageal reflux disease without esophagitis: Secondary | ICD-10-CM

## 2022-10-10 MED ORDER — PANTOPRAZOLE SODIUM 40 MG PO TBEC: 40.0000 mg | DELAYED_RELEASE_TABLET | Freq: Every day | ORAL | 0 refills | Status: DC

## 2022-10-10 NOTE — Telephone Encounter (Signed)
I refilled this for next 30 days. She is supposed to have appointment on 5/30 and we can talk about it more

## 2022-10-10 NOTE — Telephone Encounter (Signed)
NOTE NOT NEEDED ?

## 2022-10-10 NOTE — Telephone Encounter (Addendum)
Pt is requesting a refill. Pharmacy: [id number] [place], [place] - [address]   ROV 10/20/22

## 2022-10-20 ENCOUNTER — Telehealth (INDEPENDENT_AMBULATORY_CARE_PROVIDER_SITE_OTHER): Payer: 59 | Admitting: Nurse Practitioner

## 2022-10-20 ENCOUNTER — Ambulatory Visit: Payer: 59 | Admitting: Nurse Practitioner

## 2022-10-20 ENCOUNTER — Encounter: Payer: Self-pay | Admitting: Nurse Practitioner

## 2022-10-20 VITALS — BP 136/98 | HR 63 | Ht 63.0 in | Wt 259.0 lb

## 2022-10-20 DIAGNOSIS — K219 Gastro-esophageal reflux disease without esophagitis: Secondary | ICD-10-CM

## 2022-10-20 DIAGNOSIS — R030 Elevated blood-pressure reading, without diagnosis of hypertension: Secondary | ICD-10-CM | POA: Insufficient documentation

## 2022-10-20 MED ORDER — OMEPRAZOLE 40 MG PO CPDR: 40.0000 mg | DELAYED_RELEASE_CAPSULE | Freq: Every day | ORAL | 5 refills | Status: DC

## 2022-10-20 NOTE — Telephone Encounter (Signed)
PA has been sent 10/20/2022

## 2022-10-20 NOTE — Assessment & Plan Note (Signed)
Blood pressure elevated during today's visit. -Recommend she limit sodium intake and increase water. -Heart healthy diet recommended. -Reassess in August.

## 2022-10-20 NOTE — Assessment & Plan Note (Signed)
-  Insurance no longer covering pantoprazole. --Changed to omeprazole 40 mg daily. --Encouraged her to limit consumption of trigger foods and eating at night.  Recommend she sleep with HOB elevated at 30 degrees. --Reassess in August.

## 2022-10-20 NOTE — Progress Notes (Signed)
Virtual Visit via Telephone Note  I connected with Michelle Fitzgerald on 10/20/22 at  9:50 AM EDT by telephone and verified that I am speaking with the correct person using two identifiers.  Location: Patient: home  Provider: [place] [facility] at [facility]     I discussed the limitations, risks, security and privacy concerns of performing an evaluation and management service by telephone and the availability of in person appointments. I also discussed with the patient that there may be a patient responsible charge related to this service. The patient expressed understanding and agreed to proceed.   History of Present Illness: GERD -was taking pantoprazole for reflux and doing well.  -no longer covered by insurance.  -states that insurance will cover omeprazole and would like to try this  -She denies chest pain, chest pressure, or shortness of breath. She denies headaches or visual disturbances. She denies abdominal pain, nausea, vomiting, or changes in bowel or bladder habits.     Observations/Objective: Today's Vitals   10/20/22 0938 10/20/22 1010  BP: (Abnormal) 155/86 (Abnormal) 136/98  Pulse: 63   Weight: 259 lb (117.5 kg)   Height: 5\' 3"  (1.6 m)    Body mass index is 45.88 kg/m.    The patient is alert and oriented. She is pleasant and answers all questions appropriately. Breathing is non-labored. She is in no acute distress at this time.    Assessment and Plan: Gastroesophageal reflux disease without esophagitis Assessment & Plan: -Insurance no longer covering pantoprazole. --Changed to omeprazole 40 mg daily. --Encouraged her to limit consumption of trigger foods and eating at night.  Recommend she sleep with HOB elevated at 30 degrees. --Reassess in August.  Orders: -     Omeprazole; Take 1 capsule (40 mg total) by mouth daily.  Dispense: 30 capsule; Refill: 5  Elevated blood pressure reading without diagnosis of hypertension Assessment & Plan: Blood  pressure elevated during today's visit. -Recommend she limit sodium intake and increase water. -Heart healthy diet recommended. -Reassess in August.      Follow Up Instructions:    I discussed the assessment and treatment plan with the patient. The patient was provided an opportunity to ask questions and all were answered. The patient agreed with the plan and demonstrated an understanding of the instructions.   The patient was advised to call back or seek an in-person evaluation if the symptoms worsen or if the condition fails to improve as anticipated.  I provided 10 minutes of non-face-to-face time during this encounter.  Return for as scheduled.   Carlean Jews, NP

## 2022-10-20 NOTE — Telephone Encounter (Signed)
Patient called to inform that her medication omeprazole has been Denied because of it needs Prior Authorization, the number to call for PA is [phone], Thanks.

## 2022-12-21 ENCOUNTER — Ambulatory Visit: Payer: 59 | Admitting: Family Medicine

## 2022-12-22 ENCOUNTER — Other Ambulatory Visit: Payer: Self-pay

## 2022-12-22 DIAGNOSIS — Z130 Encounter for screening for diseases of the blood and blood-forming organs and certain disorders involving the immune mechanism: Secondary | ICD-10-CM

## 2022-12-22 DIAGNOSIS — Z0000 Encounter for general adult medical examination without abnormal findings: Secondary | ICD-10-CM

## 2022-12-23 ENCOUNTER — Other Ambulatory Visit: Payer: 59

## 2022-12-26 ENCOUNTER — Other Ambulatory Visit: Payer: 59

## 2022-12-30 ENCOUNTER — Encounter: Payer: 59 | Admitting: Family Medicine

## 2022-12-30 ENCOUNTER — Encounter: Payer: 59 | Admitting: Nurse Practitioner

## 2022-12-30 NOTE — Progress Notes (Deleted)
   Established Patient Office Visit  Subjective   Patient ID: Michelle Fitzgerald, female    DOB: 28-Dec-1985  Age: 37 y.o. MRN: [id number]  No chief complaint on file.   HPI Did not get labs-add vitamin D lab  Foot pain-great toe MTP arthritis, lateral ankle swelling  Snoring-did she do sleep study?  Birth control-POP?  Smoker-discussed quitting  HLD-145 LDL   The ASCVD Risk score (Arnett DK, et al., 2019) failed to calculate for the following reasons:   The 2019 ASCVD risk score is only valid for ages 73 to 56  Health Maintenance Due  Topic Date Due   Hepatitis C Screening  Never done   COVID-19 Vaccine (1 - 2023-24 season) Never done   INFLUENZA VACCINE  12/22/2022      Objective:     There were no vitals taken for this visit. {Vitals History (Optional):23777}  Physical Exam   No results found for any visits on 12/30/22.      Assessment & Plan:   There are no diagnoses linked to this encounter.   No follow-ups on file.    Sandre Kitty, MD

## 2023-06-05 ENCOUNTER — Other Ambulatory Visit: Payer: Self-pay | Admitting: Nurse Practitioner

## 2023-06-05 DIAGNOSIS — K219 Gastro-esophageal reflux disease without esophagitis: Secondary | ICD-10-CM

## 2023-06-05 DIAGNOSIS — Z30011 Encounter for initial prescription of contraceptive pills: Secondary | ICD-10-CM

## 2023-07-31 ENCOUNTER — Ambulatory Visit: Payer: Self-pay

## 2023-07-31 ENCOUNTER — Other Ambulatory Visit: Payer: Self-pay | Admitting: Family Medicine

## 2023-07-31 DIAGNOSIS — S6722XA Crushing injury of left hand, initial encounter: Secondary | ICD-10-CM

## 2023-08-01 ENCOUNTER — Other Ambulatory Visit: Payer: Self-pay | Admitting: Family Medicine

## 2023-08-01 ENCOUNTER — Ambulatory Visit: Payer: Self-pay

## 2023-08-01 DIAGNOSIS — S6732XA Crushing injury of left wrist, initial encounter: Secondary | ICD-10-CM

## 2023-08-28 ENCOUNTER — Other Ambulatory Visit: Payer: Self-pay | Admitting: Family Medicine

## 2023-08-28 ENCOUNTER — Telehealth: Payer: Self-pay

## 2023-08-28 DIAGNOSIS — K219 Gastro-esophageal reflux disease without esophagitis: Secondary | ICD-10-CM

## 2023-08-28 NOTE — Telephone Encounter (Signed)
 Patient needs a follow up for next refill

## 2023-08-28 NOTE — Telephone Encounter (Signed)
Called pt to schedule a follow up appointment

## 2023-09-25 ENCOUNTER — Other Ambulatory Visit: Payer: Self-pay | Admitting: Family Medicine

## 2023-09-25 DIAGNOSIS — K219 Gastro-esophageal reflux disease without esophagitis: Secondary | ICD-10-CM

## 2024-02-25 ENCOUNTER — Other Ambulatory Visit: Payer: Self-pay | Admitting: Nurse Practitioner

## 2024-02-25 DIAGNOSIS — K219 Gastro-esophageal reflux disease without esophagitis: Secondary | ICD-10-CM

## 2024-05-08 ENCOUNTER — Other Ambulatory Visit: Payer: Self-pay | Admitting: Family Medicine

## 2024-05-08 DIAGNOSIS — Z30011 Encounter for initial prescription of contraceptive pills: Secondary | ICD-10-CM
